# Patient Record
Sex: Female | Born: 1985 | Race: Black or African American | Hispanic: No | Marital: Single | Smoking: Never smoker
Health system: Southern US, Community
[De-identification: ages and names within clinical notes are randomized; demographics above are authoritative.]

## PROBLEM LIST (undated history)

## (undated) ENCOUNTER — Inpatient Hospital Stay: Payer: Self-pay

## (undated) DIAGNOSIS — E119 Type 2 diabetes mellitus without complications: Secondary | ICD-10-CM

## (undated) DIAGNOSIS — O009 Unspecified ectopic pregnancy without intrauterine pregnancy: Secondary | ICD-10-CM

## (undated) HISTORY — PX: ECTOPIC PREGNANCY SURGERY: SHX613

---

## 2011-05-15 ENCOUNTER — Emergency Department: Payer: Self-pay | Admitting: Emergency Medicine

## 2011-05-15 LAB — CBC
HCT: 37.9 % (ref 35.0–47.0)
HGB: 12.8 g/dL (ref 12.0–16.0)
Platelet: 261 10*3/uL (ref 150–440)
RBC: 4.2 10*6/uL (ref 3.80–5.20)
RDW: 13.3 % (ref 11.5–14.5)
WBC: 5.9 10*3/uL (ref 3.6–11.0)

## 2011-05-15 LAB — URINALYSIS, COMPLETE
Glucose,UR: NEGATIVE mg/dL (ref 0–75)
Leukocyte Esterase: NEGATIVE
Nitrite: NEGATIVE
Ph: 6 (ref 4.5–8.0)
Protein: 30
RBC,UR: 4028 /HPF (ref 0–5)
Specific Gravity: 1.025 (ref 1.003–1.030)
Squamous Epithelial: 2
WBC UR: 2 /HPF (ref 0–5)

## 2011-05-15 LAB — BASIC METABOLIC PANEL
BUN: 10 mg/dL (ref 7–18)
Calcium, Total: 8.6 mg/dL (ref 8.5–10.1)
Chloride: 107 mmol/L (ref 98–107)
Co2: 25 mmol/L (ref 21–32)
Creatinine: 0.69 mg/dL (ref 0.60–1.30)
EGFR (Non-African Amer.): >60
Glucose: 136 mg/dL — ABNORMAL HIGH (ref 65–99)
Osmolality: 284 (ref 275–301)
Potassium: 3.6 mmol/L (ref 3.5–5.1)
Sodium: 142 mmol/L (ref 136–145)

## 2011-05-15 LAB — PREGNANCY, URINE: Pregnancy Test, Urine: POSITIVE m[IU]/mL

## 2011-05-15 LAB — HCG, QUANTITATIVE, PREGNANCY: Beta Hcg, Quant.: 3456 m[IU]/mL — ABNORMAL HIGH

## 2012-02-27 ENCOUNTER — Emergency Department: Payer: Self-pay | Admitting: Emergency Medicine

## 2012-02-27 LAB — COMPREHENSIVE METABOLIC PANEL
Albumin: 4.3 g/dL (ref 3.4–5.0)
Anion Gap: 6 — ABNORMAL LOW (ref 7–16)
BUN: 8 mg/dL (ref 7–18)
Bilirubin,Total: 0.2 mg/dL (ref 0.2–1.0)
Co2: 28 mmol/L (ref 21–32)
Creatinine: 0.84 mg/dL (ref 0.60–1.30)
EGFR (African American): >60
Glucose: 136 mg/dL — ABNORMAL HIGH (ref 65–99)
Potassium: 3.5 mmol/L (ref 3.5–5.1)
SGPT (ALT): 23 U/L (ref 12–78)

## 2012-02-27 LAB — URINALYSIS, COMPLETE
Blood: NEGATIVE
Glucose,UR: NEGATIVE mg/dL (ref 0–75)
Ketone: NEGATIVE
Nitrite: NEGATIVE
Ph: 7 (ref 4.5–8.0)
Protein: NEGATIVE
Specific Gravity: 1.017 (ref 1.003–1.030)
WBC UR: <1 /HPF (ref 0–5)

## 2012-02-27 LAB — CBC
HCT: 39.8 % (ref 35.0–47.0)
HGB: 13.5 g/dL (ref 12.0–16.0)
MCH: 30 pg (ref 26.0–34.0)
MCV: 89 fL (ref 80–100)
RBC: 4.49 10*6/uL (ref 3.80–5.20)
RDW: 12.8 % (ref 11.5–14.5)
WBC: 5.7 10*3/uL (ref 3.6–11.0)

## 2012-03-14 ENCOUNTER — Emergency Department: Payer: Self-pay | Admitting: Emergency Medicine

## 2012-03-14 LAB — URINALYSIS, COMPLETE
Bacteria: NONE SEEN
Bilirubin,UR: NEGATIVE
Blood: NEGATIVE
Glucose,UR: 50 mg/dL (ref 0–75)
Ketone: NEGATIVE
Leukocyte Esterase: NEGATIVE
Ph: 6 (ref 4.5–8.0)
Protein: NEGATIVE
RBC,UR: 1 /HPF (ref 0–5)
Specific Gravity: 1.025 (ref 1.003–1.030)
Squamous Epithelial: <1
WBC UR: 1 /HPF (ref 0–5)

## 2012-03-14 LAB — CBC
HGB: 12.7 g/dL (ref 12.0–16.0)
MCHC: 32.6 g/dL (ref 32.0–36.0)
MCV: 88 fL (ref 80–100)
Platelet: 286 10*3/uL (ref 150–440)
RBC: 4.4 10*6/uL (ref 3.80–5.20)

## 2012-03-14 LAB — HCG, QUANTITATIVE, PREGNANCY: Beta Hcg, Quant.: 375 m[IU]/mL — ABNORMAL HIGH

## 2012-03-18 ENCOUNTER — Emergency Department: Payer: Self-pay | Admitting: Emergency Medicine

## 2012-03-18 LAB — URINALYSIS, COMPLETE
Bacteria: NONE SEEN
Ketone: NEGATIVE
Nitrite: NEGATIVE
Protein: NEGATIVE
RBC,UR: <1 /HPF (ref 0–5)
Specific Gravity: 1.006 (ref 1.003–1.030)
Squamous Epithelial: <1
WBC UR: <1 /HPF (ref 0–5)

## 2012-03-18 LAB — CBC
HCT: 36.6 % (ref 35.0–47.0)
MCH: 30.4 pg (ref 26.0–34.0)
MCHC: 34.7 g/dL (ref 32.0–36.0)
Platelet: 292 10*3/uL (ref 150–440)
RDW: 12.7 % (ref 11.5–14.5)

## 2012-03-31 ENCOUNTER — Ambulatory Visit: Payer: Self-pay | Admitting: Obstetrics and Gynecology

## 2012-03-31 LAB — CBC
HGB: 12.8 g/dL (ref 12.0–16.0)
MCH: 30.6 pg (ref 26.0–34.0)
MCHC: 34.7 g/dL (ref 32.0–36.0)
Platelet: 289 10*3/uL (ref 150–440)
RDW: 13 % (ref 11.5–14.5)
WBC: 8.6 10*3/uL (ref 3.6–11.0)

## 2012-03-31 LAB — COMPREHENSIVE METABOLIC PANEL
Albumin: 4.2 g/dL (ref 3.4–5.0)
Alkaline Phosphatase: 67 U/L (ref 50–136)
Anion Gap: 8 (ref 7–16)
Bilirubin,Total: 0.3 mg/dL (ref 0.2–1.0)
Calcium, Total: 9.1 mg/dL (ref 8.5–10.1)
Chloride: 105 mmol/L (ref 98–107)
Co2: 24 mmol/L (ref 21–32)
Creatinine: 0.72 mg/dL (ref 0.60–1.30)
EGFR (African American): >60
EGFR (Non-African Amer.): >60
Glucose: 149 mg/dL — ABNORMAL HIGH (ref 65–99)
Osmolality: 278 (ref 275–301)
Potassium: 3.5 mmol/L (ref 3.5–5.1)
SGOT(AST): 11 U/L — ABNORMAL LOW (ref 15–37)
SGPT (ALT): 21 U/L (ref 12–78)
Sodium: 137 mmol/L (ref 136–145)
Total Protein: 8 g/dL (ref 6.4–8.2)

## 2012-03-31 LAB — URINALYSIS, COMPLETE
Bilirubin,UR: NEGATIVE
Nitrite: NEGATIVE
Protein: NEGATIVE
Specific Gravity: 1.03 (ref 1.003–1.030)
Squamous Epithelial: 1
WBC UR: 3 /HPF (ref 0–5)

## 2012-03-31 LAB — LIPASE, BLOOD: Lipase: 198 U/L (ref 73–393)

## 2012-03-31 LAB — HCG, QUANTITATIVE, PREGNANCY: Beta Hcg, Quant.: 23827 m[IU]/mL — ABNORMAL HIGH

## 2012-04-01 LAB — CBC
HCT: 32.5 % — ABNORMAL LOW (ref 35.0–47.0)
RBC: 3.69 10*6/uL — ABNORMAL LOW (ref 3.80–5.20)
RDW: 13.4 % (ref 11.5–14.5)
WBC: 10.8 10*3/uL (ref 3.6–11.0)

## 2012-04-01 LAB — WET PREP, GENITAL

## 2013-01-18 ENCOUNTER — Emergency Department: Payer: Self-pay | Admitting: Internal Medicine

## 2013-01-18 LAB — COMPREHENSIVE METABOLIC PANEL
Albumin: 3.9 g/dL (ref 3.4–5.0)
Alkaline Phosphatase: 70 U/L
Bilirubin,Total: 0.3 mg/dL (ref 0.2–1.0)
Calcium, Total: 9.1 mg/dL (ref 8.5–10.1)
Chloride: 106 mmol/L (ref 98–107)
Co2: 28 mmol/L (ref 21–32)
Creatinine: 0.74 mg/dL (ref 0.60–1.30)
EGFR (Non-African Amer.): >60
Glucose: 90 mg/dL (ref 65–99)
SGPT (ALT): 17 U/L (ref 12–78)
Sodium: 138 mmol/L (ref 136–145)
Total Protein: 7.7 g/dL (ref 6.4–8.2)

## 2013-01-18 LAB — CBC
HCT: 36.7 % (ref 35.0–47.0)
MCH: 29.5 pg (ref 26.0–34.0)
MCHC: 34.3 g/dL (ref 32.0–36.0)
Platelet: 286 10*3/uL (ref 150–440)
RBC: 4.27 10*6/uL (ref 3.80–5.20)
RDW: 12.7 % (ref 11.5–14.5)

## 2013-03-21 ENCOUNTER — Emergency Department: Payer: Self-pay | Admitting: Emergency Medicine

## 2013-03-21 LAB — CBC WITH DIFFERENTIAL/PLATELET
Basophil #: 0.1 10*3/uL (ref 0.0–0.1)
Basophil %: 1.2 %
EOS ABS: 0 10*3/uL (ref 0.0–0.7)
Eosinophil %: 0.9 %
HCT: 38.5 % (ref 35.0–47.0)
HGB: 12.6 g/dL (ref 12.0–16.0)
Lymphocyte #: 1.6 10*3/uL (ref 1.0–3.6)
Lymphocyte %: 31 %
MCH: 28.9 pg (ref 26.0–34.0)
MCHC: 32.7 g/dL (ref 32.0–36.0)
MCV: 88 fL (ref 80–100)
Monocyte #: 0.3 x10 3/mm (ref 0.2–0.9)
Monocyte %: 5.6 %
Neutrophil #: 3.2 10*3/uL (ref 1.4–6.5)
Neutrophil %: 61.3 %
PLATELETS: 301 10*3/uL (ref 150–440)
RBC: 4.36 10*6/uL (ref 3.80–5.20)
RDW: 12.9 % (ref 11.5–14.5)
WBC: 5.2 10*3/uL (ref 3.6–11.0)

## 2013-03-21 LAB — URINALYSIS, COMPLETE
Bacteria: NONE SEEN
Bilirubin,UR: NEGATIVE
Glucose,UR: NEGATIVE mg/dL (ref 0–75)
KETONE: NEGATIVE
Leukocyte Esterase: NEGATIVE
Nitrite: NEGATIVE
Ph: 6 (ref 4.5–8.0)
Protein: NEGATIVE
RBC,UR: 11 /HPF (ref 0–5)
Specific Gravity: 1.03 (ref 1.003–1.030)
Squamous Epithelial: 1

## 2013-03-21 LAB — COMPREHENSIVE METABOLIC PANEL
ALBUMIN: 3.7 g/dL (ref 3.4–5.0)
ALT: 19 U/L (ref 12–78)
AST: 20 U/L (ref 15–37)
Alkaline Phosphatase: 69 U/L
Anion Gap: 4 — ABNORMAL LOW (ref 7–16)
BUN: 9 mg/dL (ref 7–18)
Bilirubin,Total: 0.2 mg/dL (ref 0.2–1.0)
CHLORIDE: 105 mmol/L (ref 98–107)
CO2: 28 mmol/L (ref 21–32)
CREATININE: 0.84 mg/dL (ref 0.60–1.30)
Calcium, Total: 9.3 mg/dL (ref 8.5–10.1)
EGFR (Non-African Amer.): >60
GLUCOSE: 123 mg/dL — AB (ref 65–99)
Osmolality: 274 (ref 275–301)
Potassium: 3.7 mmol/L (ref 3.5–5.1)
Sodium: 137 mmol/L (ref 136–145)
Total Protein: 7.5 g/dL (ref 6.4–8.2)

## 2013-05-17 ENCOUNTER — Emergency Department: Payer: Self-pay | Admitting: Emergency Medicine

## 2013-05-17 LAB — URINALYSIS, COMPLETE
Bilirubin,UR: NEGATIVE
Blood: NEGATIVE
Glucose,UR: NEGATIVE mg/dL (ref 0–75)
Leukocyte Esterase: NEGATIVE
Nitrite: NEGATIVE
Ph: 5 (ref 4.5–8.0)
Protein: NEGATIVE
Specific Gravity: 1.018 (ref 1.003–1.030)
Squamous Epithelial: 2

## 2013-05-17 LAB — COMPREHENSIVE METABOLIC PANEL
Albumin: 4.3 g/dL (ref 3.4–5.0)
Alkaline Phosphatase: 85 U/L
Anion Gap: 7 (ref 7–16)
BUN: 18 mg/dL (ref 7–18)
Bilirubin,Total: 0.5 mg/dL (ref 0.2–1.0)
CHLORIDE: 103 mmol/L (ref 98–107)
Calcium, Total: 9.3 mg/dL (ref 8.5–10.1)
Co2: 25 mmol/L (ref 21–32)
Creatinine: 0.84 mg/dL (ref 0.60–1.30)
EGFR (Non-African Amer.): >60
GLUCOSE: 172 mg/dL — AB (ref 65–99)
OSMOLALITY: 276 (ref 275–301)
Potassium: 3.8 mmol/L (ref 3.5–5.1)
SGOT(AST): 20 U/L (ref 15–37)
SGPT (ALT): 23 U/L (ref 12–78)
SODIUM: 135 mmol/L — AB (ref 136–145)
Total Protein: 9 g/dL — ABNORMAL HIGH (ref 6.4–8.2)

## 2013-05-17 LAB — CBC WITH DIFFERENTIAL/PLATELET
Basophil #: 0.1 10*3/uL (ref 0.0–0.1)
Basophil %: 0.7 %
EOS ABS: 0 10*3/uL (ref 0.0–0.7)
Eosinophil %: 0.3 %
HCT: 44.7 % (ref 35.0–47.0)
HGB: 14.7 g/dL (ref 12.0–16.0)
LYMPHS ABS: 1.6 10*3/uL (ref 1.0–3.6)
Lymphocyte %: 14.3 %
MCH: 28.8 pg (ref 26.0–34.0)
MCHC: 32.9 g/dL (ref 32.0–36.0)
MCV: 88 fL (ref 80–100)
MONOS PCT: 3.4 %
Monocyte #: 0.4 x10 3/mm (ref 0.2–0.9)
NEUTROS ABS: 9.1 10*3/uL — AB (ref 1.4–6.5)
Neutrophil %: 81.3 %
Platelet: 318 10*3/uL (ref 150–440)
RBC: 5.11 10*6/uL (ref 3.80–5.20)
RDW: 13.8 % (ref 11.5–14.5)
WBC: 11.2 10*3/uL — AB (ref 3.6–11.0)

## 2013-05-17 LAB — LIPASE, BLOOD: Lipase: 161 U/L (ref 73–393)

## 2013-09-13 IMAGING — US US OB < 14 WEEKS - US OB TV
1 series · 14 of 28 positions shown · non-contrast
Comparison: none

REASON FOR EXAM: preg, cramping and bleediing
COMMENTS:

PROCEDURE:     US  - US OB LESS THAN 14 WEEKS/W TRANS  - March 14, 2012 [DATE]
RESULT:     Comparison: None
TECHNIQUE: Multiple transabdominal gray-scale images and endovaginal
gray-scale images of the pelvis performed.

[Series 1: us ob < 14 weeks - us ob tv · 0.28mm/px · 14 of 50 slices shown]
[im 2/50]
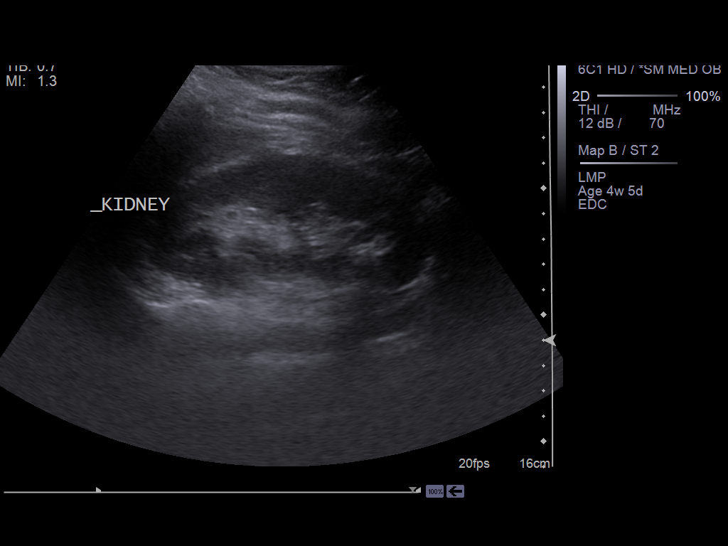
[im 6/50]
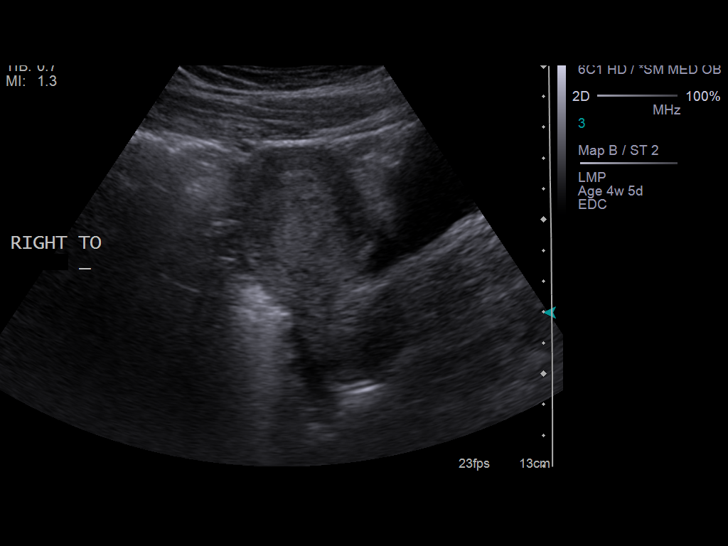
[im 10/50]
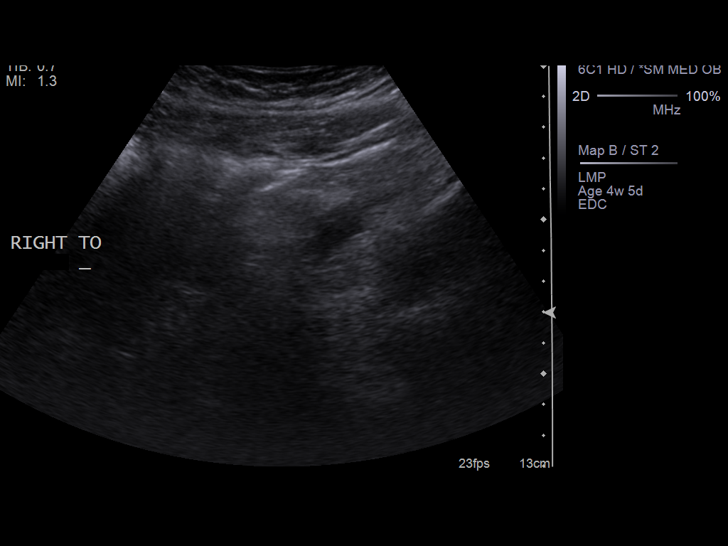
[im 13/50]
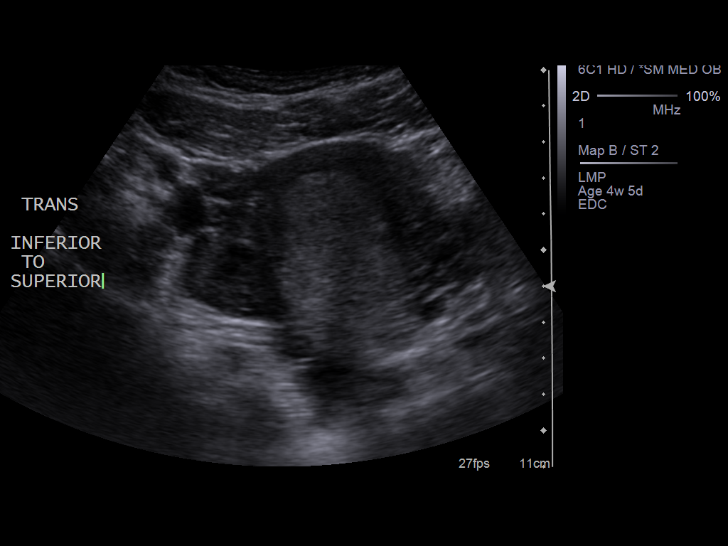
[im 17/50]
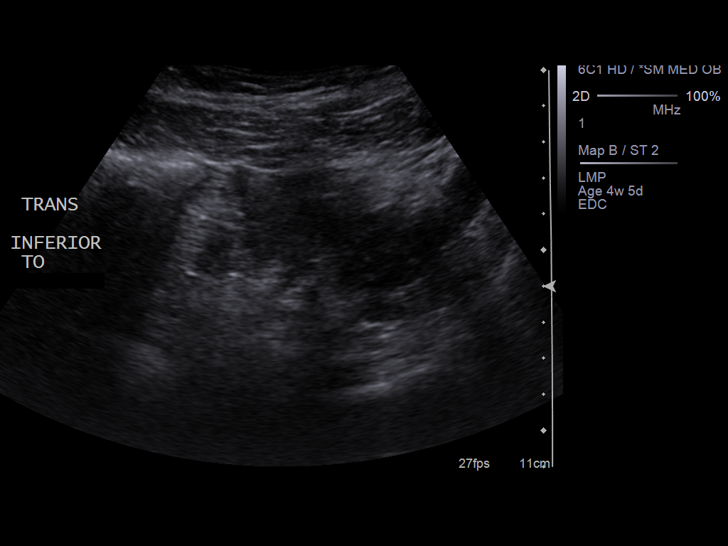
[im 20/50]
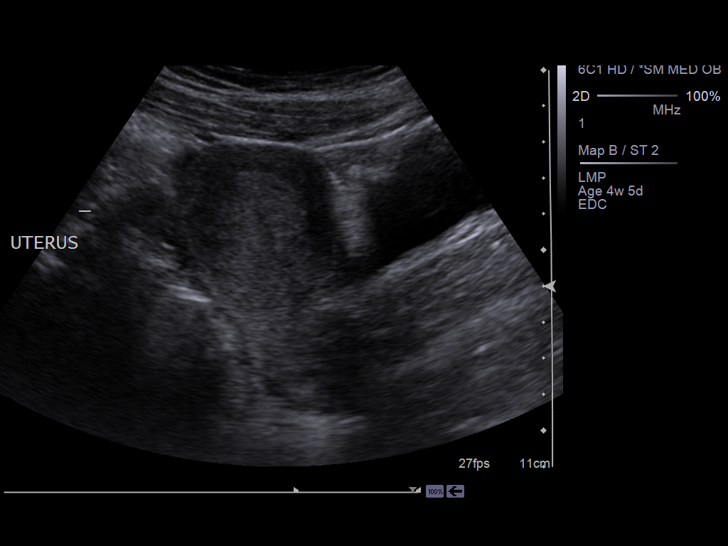
[im 24/50]
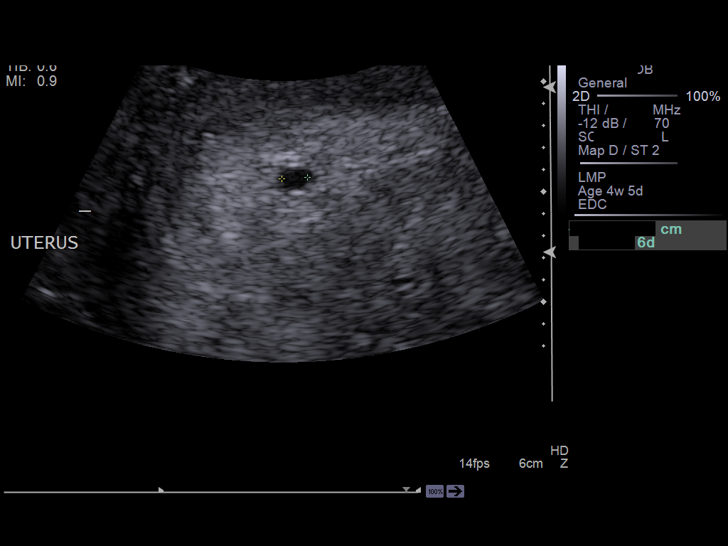
[im 28/50]
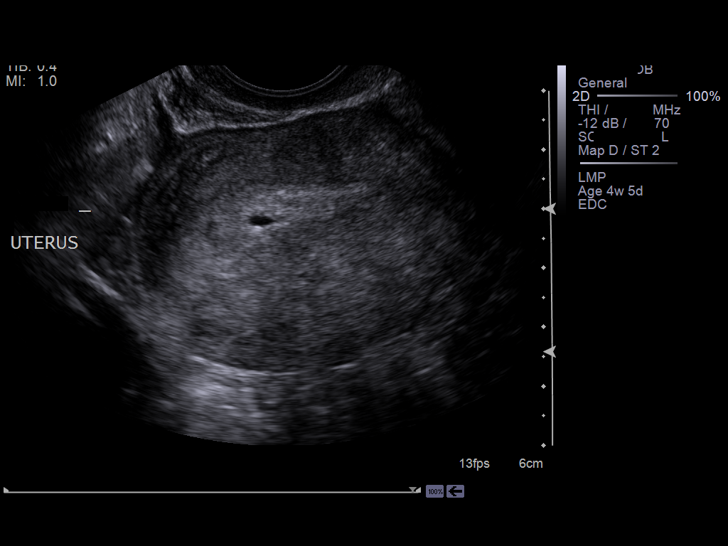
[im 31/50]
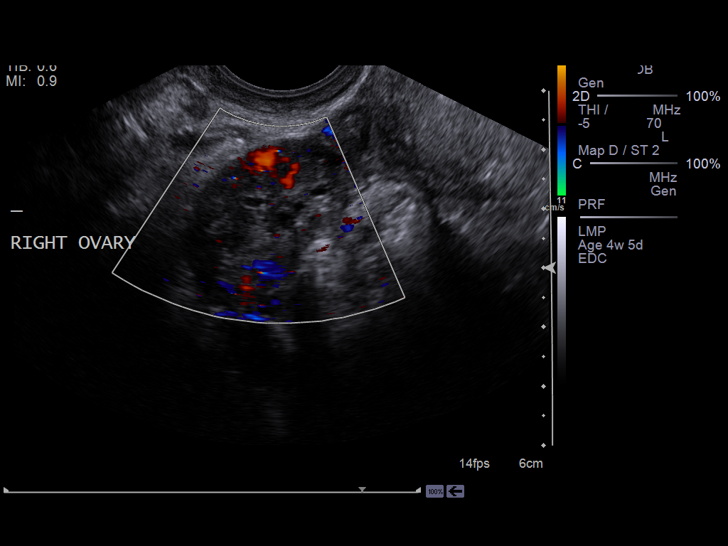
[im 35/50]
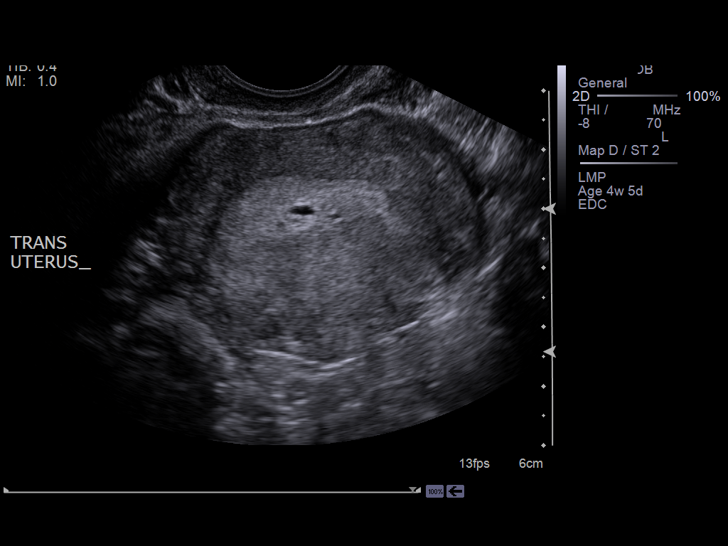
[im 39/50]
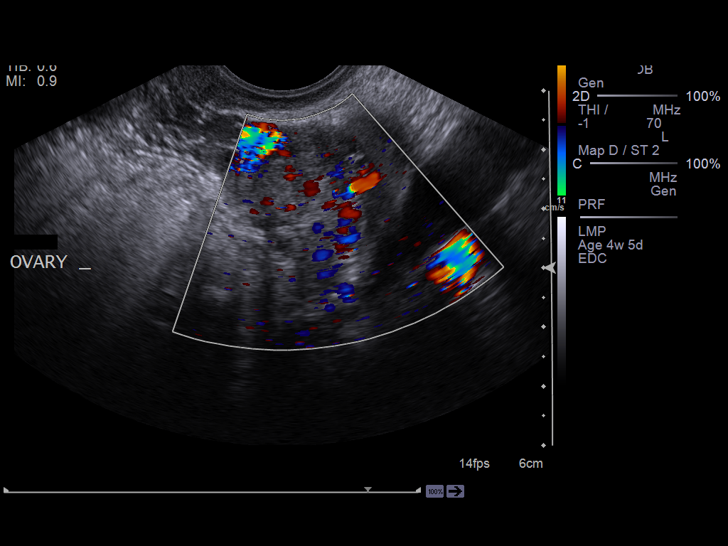
[im 42/50]
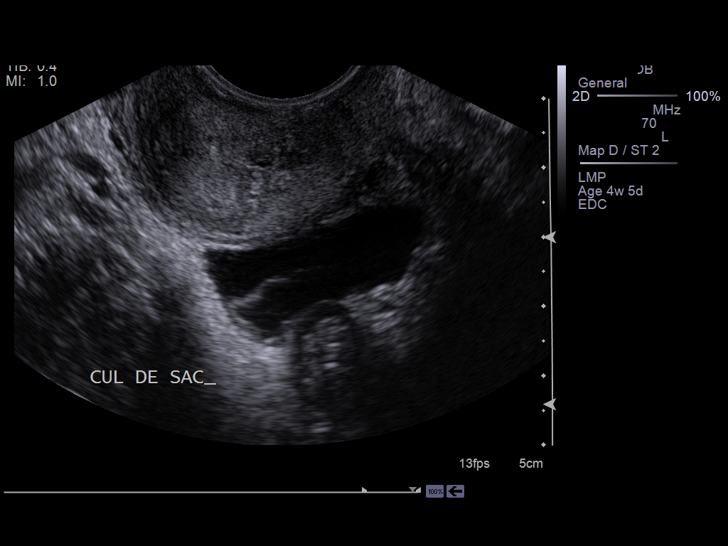
[im 46/50]
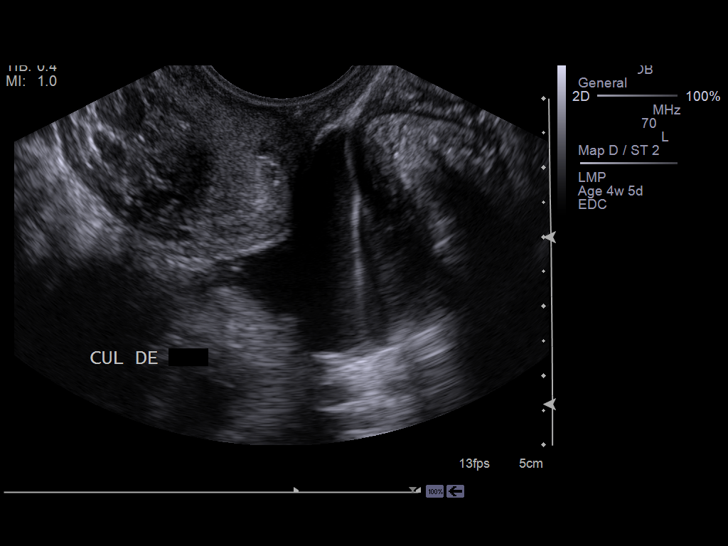
[im 50/50]
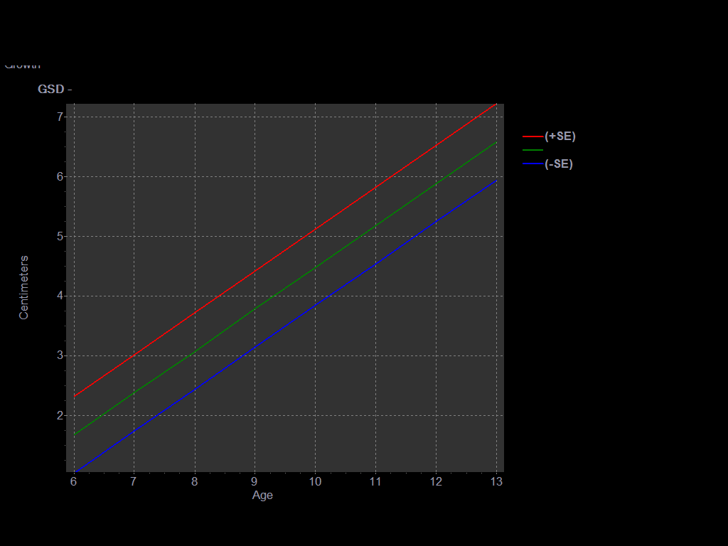

[14 of 28 positions shown; findings below may reference images not displayed]

FINDINGS: There is an intrauterine cystic structure measuring 3 mm. There is no fetal
pole or yolk sac.

The right ovary measures 4.1 x 2.4 x 2.5 cm.  The left ovary measures 3.9 x
2.5 x 2.1 cm.  There is no adnexal mass.

There is no pelvic free fluid.
IMPRESSION: Intrauterine cystic structure. Differential diagnoses include pregnancy too
early to detect versus ectopic pregnancy versus blighted ovum. Recommend
clinical correlation, serial quantitative beta HCGs, ectopic precautions,
and followup ultrasound as clinically indicated.

[REDACTED]

## 2013-09-17 IMAGING — US US OB < 14 WEEKS - US OB TV
1 series · 14 of 28 positions shown · non-contrast
Comparison: none

REASON FOR EXAM: 5 weeks with pelvic pain and vaginal bleeding
COMMENTS:

PROCEDURE:     US  - US OB LESS THAN 14 WEEKS/W TRANS  - March 18, 2012  [DATE]
RESULT:      Comparison: None
TECHNIQUE: Multiple transabdominal gray-scale images and endovaginal
gray-scale images of the pelvis performed.

[Series 1: us ob < 14 weeks - us ob tv · 0.23mm/px · 14 of 77 slices shown]
[im 3/77]
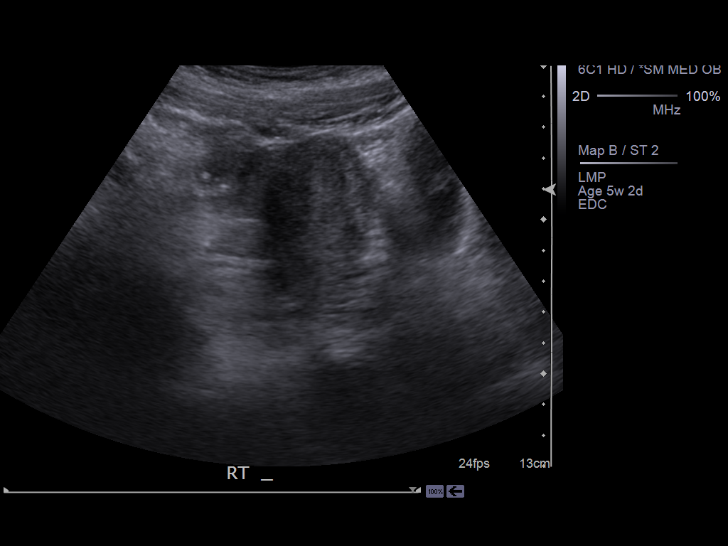
[im 9/77]
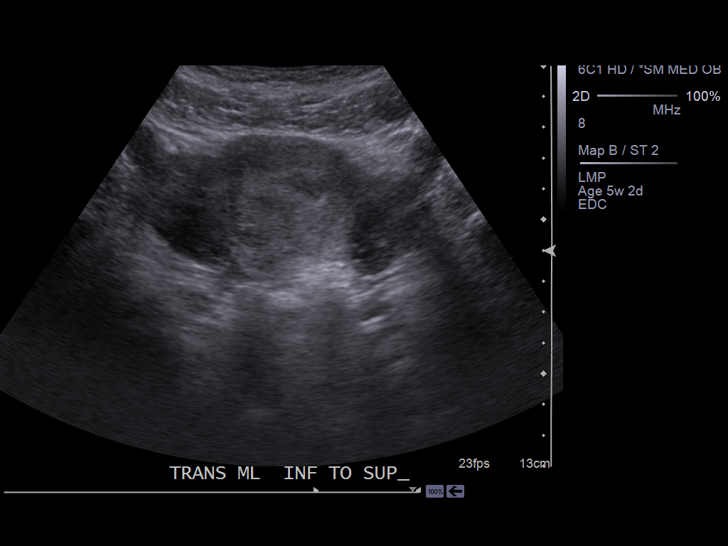
[im 15/77]
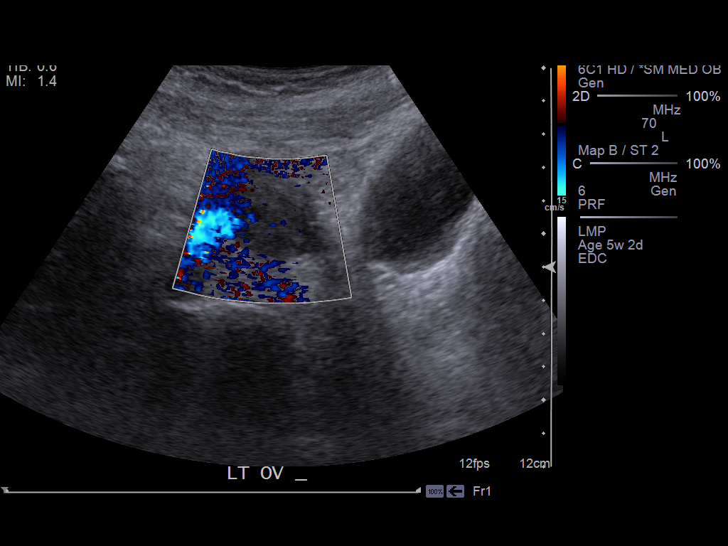
[im 20/77]
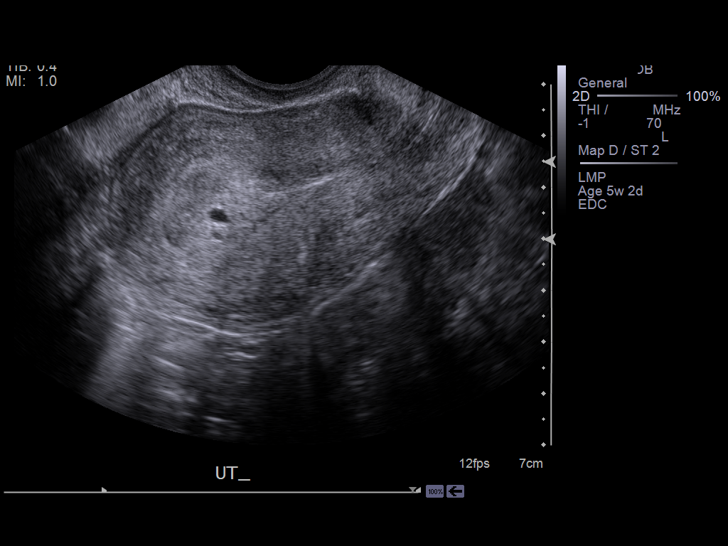
[im 26/77]
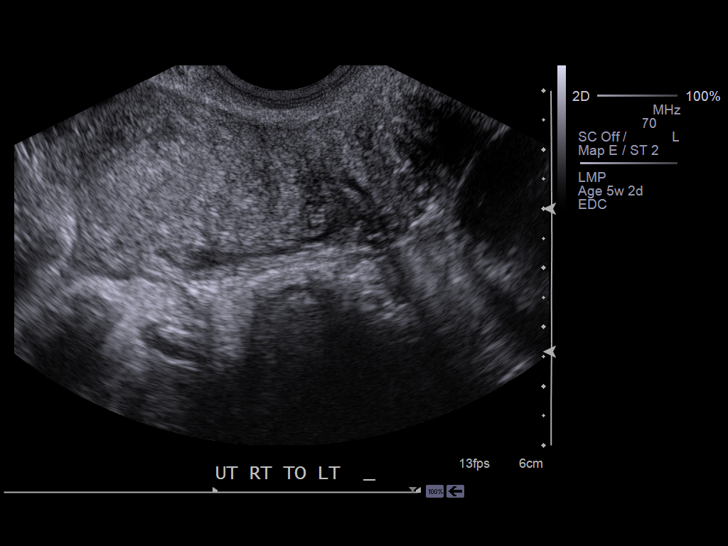
[im 31/77]
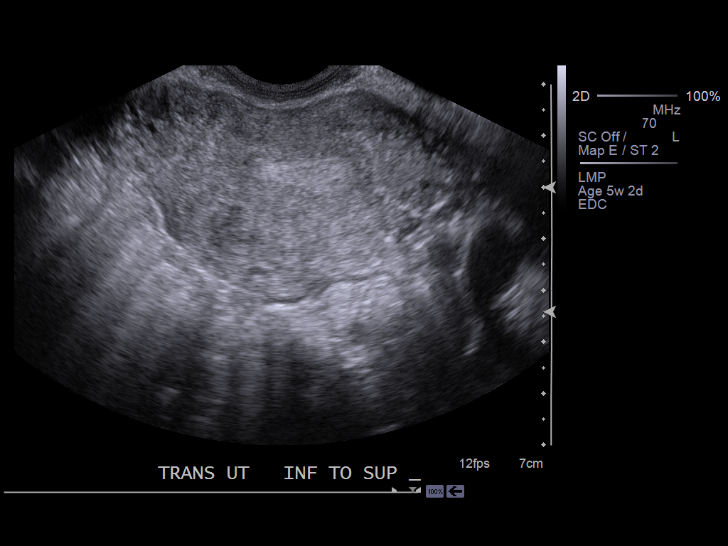
[im 37/77]
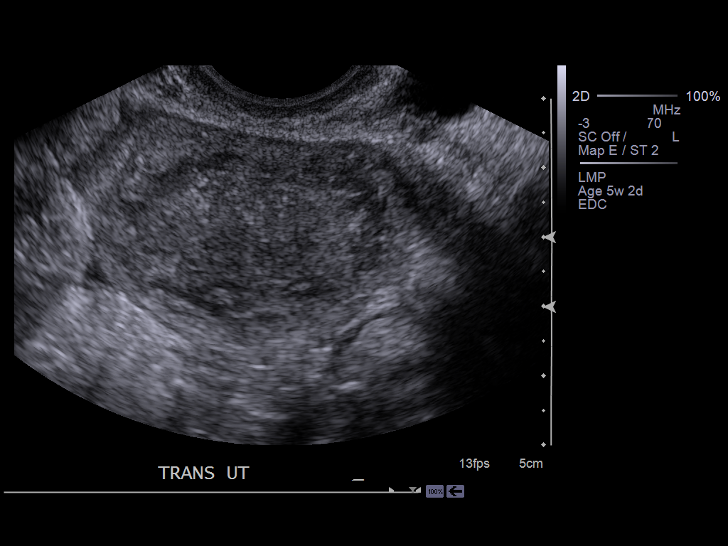
[im 43/77]
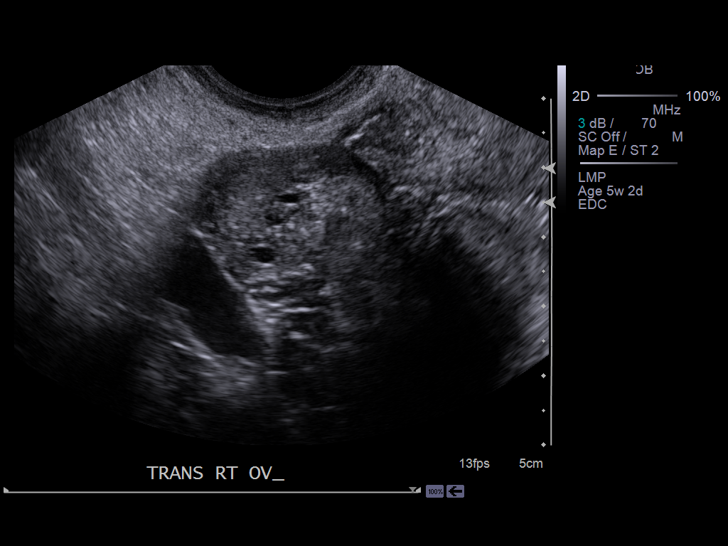
[im 48/77]
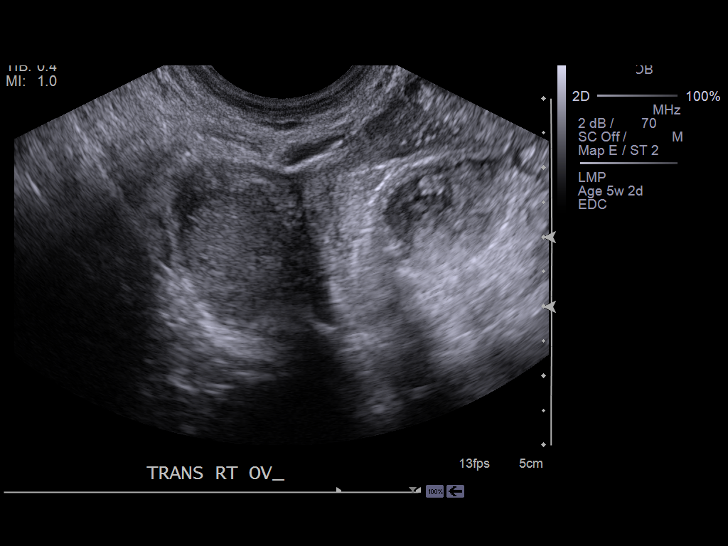
[im 54/77]
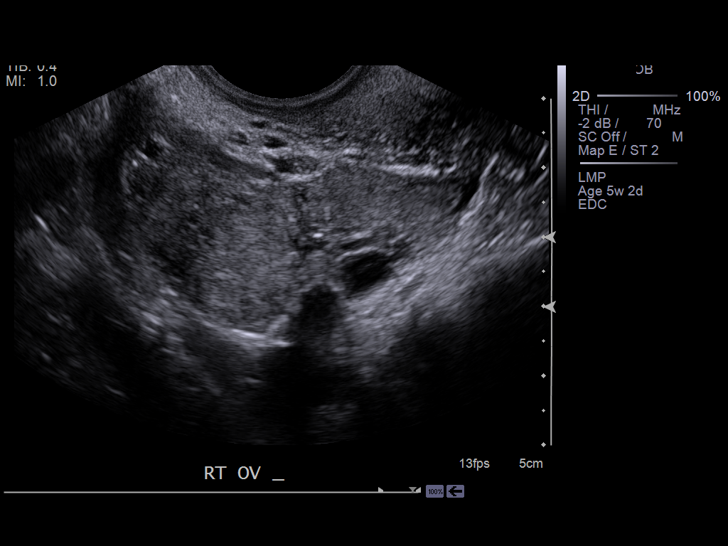
[im 60/77]
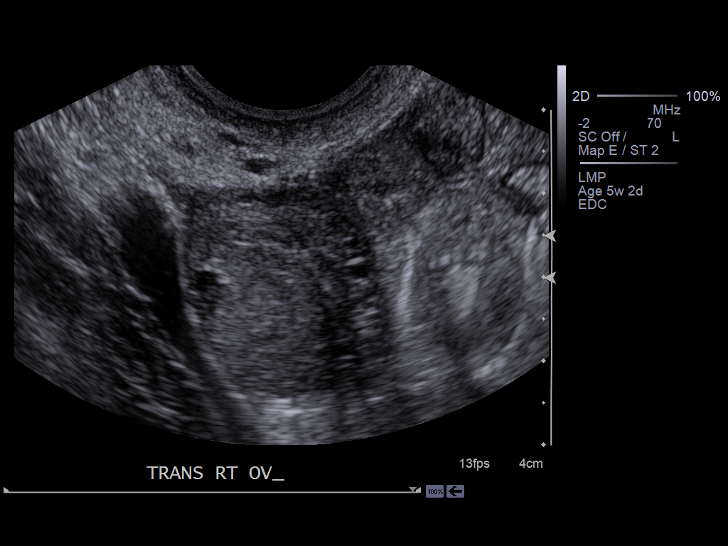
[im 65/77]
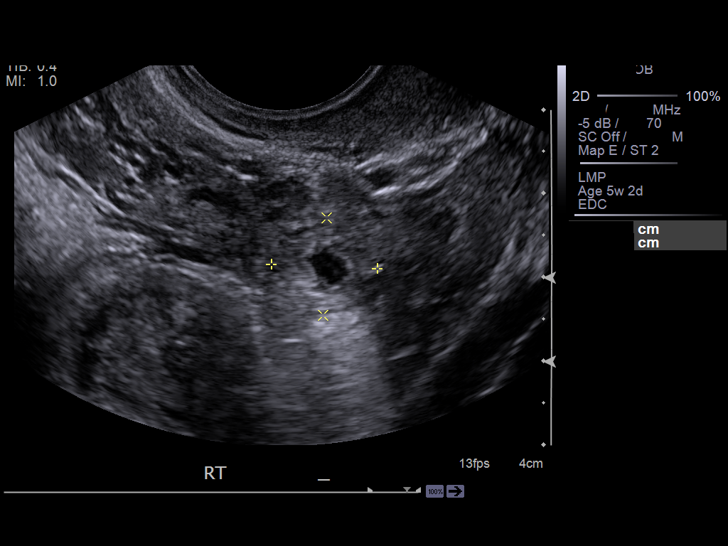
[im 71/77]
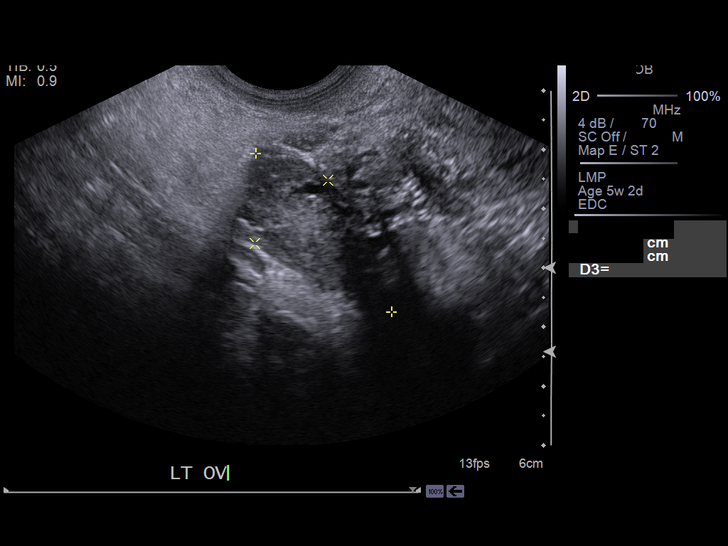
[im 77/77]
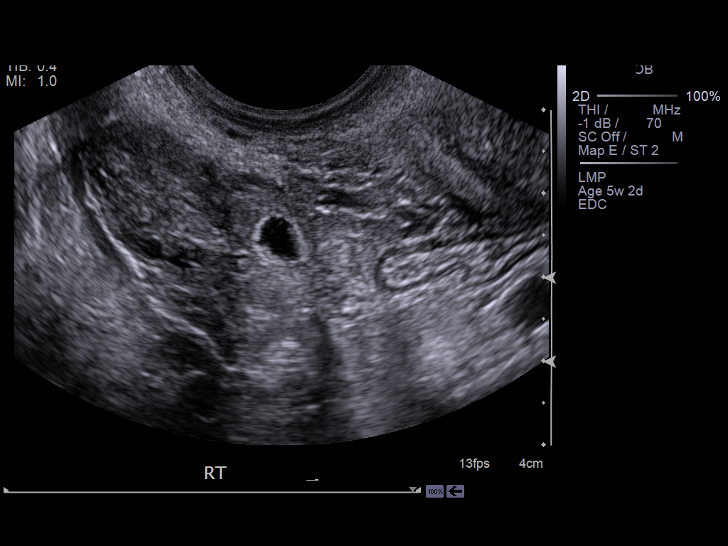

[14 of 28 positions shown; findings below may reference images not displayed]

FINDINGS: There is a cystic structure within the uterus measuring 3.8 mm which would
date the pregnancy at 5 weeks one day. There is no fetal pole or yolk sac.

The right ovary measures 3.2 x 2.3 x 3.7 cm.  The left ovary measures 3.5 x
1.7, 1.6 cm. There is a 1.6 x 1.2 x 1.2 cm hypoechoic right ovarian mass
with surrounding Doppler flow which may represent a corpus luteum cyst.

There is a small amount of pelvic free fluid.
IMPRESSION: Small cystic structure within the uterus without a fetal pole or yolk sac.
This may represent a true gestational sac versus pseudogestational sac.
Differential diagnoses include a pregnant to early to detect versus blighted
ovum versus ectopic pregnancy. Recommend clinical correlation, serial
quantitative beta HCGs, ectopic precautions, and short-term followup
ultrasound.

[REDACTED]

## 2014-06-07 NOTE — H&P (Signed)
   Subjective/Chief Complaint pain in her abdomen   History of Present Illness 26 yp G3P1011 wiuth ectopic of unknown age. by US with blood up to her Morrisons pouch, she does not smoke she does not douche and no abnl pap recently   Past Medical Health Diabetes Mellitus   Past Med/Surgical Hx:  NIDDM:   denies surg hx:   ALLERGIES:  Latex: Swelling  Family and Social History:  Family History Non-Contributory   Social History negative tobacco, negative ETOH   Place of Living Home   Review of Systems:  Fever/Chills No   Cough No   Sputum No   Abdominal Pain Yes   Diarrhea No   Constipation Yes   Nausea/Vomiting No   SOB/DOE No   Chest Pain No   Dysuria No   Tolerating Diet Nauseated   Medications/Allergies Reviewed Medications/Allergies reviewed   Physical Exam:  GEN well developed, well nourished   HEENT PERRL   NECK supple  No masses  thyroid not tender   RESP normal resp effort   CARD regular rate  No LE edema   ABD positive tenderness   GU no superpubic tenderness   LYMPH negative neck, negative axillae   EXTR negative cyanosis/clubbing, negative edema   SKIN normal to palpation, No rashes   NEURO cranial nerves intact   PSYCH alert    Assessment/Admission Diagnosis ectopic pregnanacy with blood in abdomen   Plan l/s ectopic removal   Electronic Signatures: Adria DevonKlett, Elah Avellino (MD)  (Signed 15-Feb-14 14:44)  Authored: CHIEF COMPLAINT and HISTORY, PAST MEDICAL/SURGIAL HISTORY, ALLERGIES, FAMILY AND SOCIAL HISTORY, REVIEW OF SYSTEMS, PHYSICAL EXAM, ASSESSMENT AND PLAN   Last Updated: 15-Feb-14 14:44 by Adria DevonKlett, Micael Barb (MD)

## 2014-06-07 NOTE — Op Note (Signed)
PATIENT NAME:  Meredith Rasmussen, [name] MR#:  [id number] DATE OF BIRTH:  July 17, 1985  DATE OF PROCEDURE:  04/01/2012  PREOPERATIVE DIAGNOSIS: Right ruptured ectopic with blood to Morison's pouch.  POSTOPERATIVE DIAGNOSIS:  Right ruptured ectopic with blood to Morison's pouch.  PROCEDURE PERFORMED: Laparoscopic right salpingectomy and irrigation of the abdomen.   SURGEON: Loura Backarried Alix Stowers, MD  ESTIMATED BLOOD LOSS: Approximately 200 mL, however, approximately 1000 mL were evacuated from the abdomen from the rupture that was old and clotted.  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  DESCRIPTION OF PROCEDURE: The patient was taken to the operating room and placed in supine position. After adequate general endotracheal anesthesia was instilled, the patient was prepped and draped in the usual sterile fashion. The patient had been consented for this surgery prior to her surgery with her boyfriend and a nurse present. A side-opening speculum was placed in the patient's vagina, the anterior lip of the cervix was grasped with a single-tooth tenaculum and Hulka tenaculum was placed into the cervix. The single-tooth tenaculum was removed as was the speculum. Attention was turned to the belly where the umbilicus was injected with Marcaine and the incision was made. Veress needle was placed. CO2 was placed on low flow. When tympany was heard around the liver, CO2 was placed on high flow. The Veress needle was removed after hanging drop test, fluid instillation test and fluid aspiration test showed proper placement of the Veress needle. Xcel trocar was used to enter into the abdomen and the patient was placed in Trendelenburg. The aforementioned findings were seen with belly full of blood and old clots were seen. This was  aspirated and irrigated enough to see the right tube. The right tube was then grasped after a 5 mm and 10 mm trocar port was placed in the left and the right lower quadrants. The tube was then grasped, the Harmonic scalpel was used and the tube was cut off of the tubal junction into the uterus and the peritoneal tissue of the broad ligament. This bleeding immediately stopped. At this time, the ectopic was identified in the pelvis. This was grasped. The tube and the ectopic were sent off to pathology. The remainder of the surgery consisted of aspirating the blood that was up to Morison's pouch, irrigating the belly and removing the clots. Approximately 9000 mL of fluid and blood was aspirated. The lower trocars were removed. The CO2 was allowed to escape from the abdomen. The incisions were closed. The bellybutton trocar was removed. UR-6 with 4-0 Monocryl was used to close the incision. Dermabond was placed. The Hulka tenaculum was removed from the patient's cervix. The patient was laid flat and taken to recovery after having tolerated the procedure well.  ____________________________ Elliot Gurneyarrie C. Jeven Topper, MD cck:sb D: 04/10/2012 08:38:00 ET T: 04/10/2012 10:16:05 ET JOB#: [id number]  cc: Elliot Gurneyarrie C. Anwar Sakata, MD, <Dictator> Elliot GurneyARRIE C Boni Maclellan MD ELECTRONICALLY SIGNED 04/11/2012 19:01

## 2014-07-17 ENCOUNTER — Emergency Department: Payer: No Typology Code available for payment source

## 2014-07-17 ENCOUNTER — Emergency Department
Admission: EM | Admit: 2014-07-17 | Discharge: 2014-07-17 | Disposition: A | Discharge status: Home / Self Care | Payer: No Typology Code available for payment source | Attending: Emergency Medicine | Admitting: Emergency Medicine

## 2014-07-17 DIAGNOSIS — Z3A11 11 weeks gestation of pregnancy: Secondary | ICD-10-CM | POA: Insufficient documentation

## 2014-07-17 DIAGNOSIS — N390 Urinary tract infection, site not specified: Secondary | ICD-10-CM

## 2014-07-17 DIAGNOSIS — O30091 Twin pregnancy, unable to determine number of placenta and number of amniotic sacs, first trimester: Secondary | ICD-10-CM | POA: Diagnosis not present

## 2014-07-17 DIAGNOSIS — N939 Abnormal uterine and vaginal bleeding, unspecified: Secondary | ICD-10-CM

## 2014-07-17 DIAGNOSIS — O200 Threatened abortion: Secondary | ICD-10-CM | POA: Insufficient documentation

## 2014-07-17 DIAGNOSIS — Z79899 Other long term (current) drug therapy: Secondary | ICD-10-CM | POA: Insufficient documentation

## 2014-07-17 DIAGNOSIS — O209 Hemorrhage in early pregnancy, unspecified: Secondary | ICD-10-CM | POA: Diagnosis present

## 2014-07-17 DIAGNOSIS — O2341 Unspecified infection of urinary tract in pregnancy, first trimester: Secondary | ICD-10-CM | POA: Insufficient documentation

## 2014-07-17 DIAGNOSIS — O24311 Unspecified pre-existing diabetes mellitus in pregnancy, first trimester: Secondary | ICD-10-CM | POA: Diagnosis not present

## 2014-07-17 DIAGNOSIS — O30001 Twin pregnancy, unspecified number of placenta and unspecified number of amniotic sacs, first trimester: Secondary | ICD-10-CM

## 2014-07-17 HISTORY — DX: Type 2 diabetes mellitus without complications: E11.9

## 2014-07-17 HISTORY — DX: Unspecified ectopic pregnancy without intrauterine pregnancy: O00.90

## 2014-07-17 LAB — URINALYSIS COMPLETE WITH MICROSCOPIC (ARMC ONLY)
BILIRUBIN URINE: NEGATIVE
Glucose, UA: NEGATIVE mg/dL
NITRITE: NEGATIVE
PH: 5 (ref 5.0–8.0)
Protein, ur: 30 mg/dL — AB
Specific Gravity, Urine: 1.026 (ref 1.005–1.030)

## 2014-07-17 LAB — CBC
HEMATOCRIT: 34.9 % — AB (ref 35.0–47.0)
Hemoglobin: 11.9 g/dL — ABNORMAL LOW (ref 12.0–16.0)
MCH: 29.3 pg (ref 26.0–34.0)
MCHC: 34.2 g/dL (ref 32.0–36.0)
MCV: 85.6 fL (ref 80.0–100.0)
Platelets: 315 10*3/uL (ref 150–440)
RBC: 4.07 MIL/uL (ref 3.80–5.20)
RDW: 13.8 % (ref 11.5–14.5)
WBC: 9 10*3/uL (ref 3.6–11.0)

## 2014-07-17 LAB — WET PREP, GENITAL
Clue Cells Wet Prep HPF POC: NONE SEEN
TRICH WET PREP: NONE SEEN
Yeast Wet Prep HPF POC: NONE SEEN

## 2014-07-17 LAB — CHLAMYDIA/NGC RT PCR (ARMC ONLY)
CHLAMYDIA TR: NOT DETECTED
N gonorrhoeae: NOT DETECTED

## 2014-07-17 LAB — ABO/RH
ABO/RH(D): B POS
ABO/RH(D): B POS

## 2014-07-17 LAB — GLUCOSE, CAPILLARY: GLUCOSE-CAPILLARY: 109 mg/dL — AB (ref 65–99)

## 2014-07-17 LAB — HCG, QUANTITATIVE, PREGNANCY: hCG, Beta Chain, Quant, S: 221071 m[IU]/mL — ABNORMAL HIGH (ref <5)

## 2014-07-17 MED ORDER — NITROFURANTOIN MONOHYD MACRO 100 MG PO CAPS: 100.0000 mg | ORAL_CAPSULE | Freq: Two times a day (BID) | ORAL | Status: AC

## 2014-07-17 MED ORDER — NITROFURANTOIN MONOHYD MACRO 100 MG PO CAPS
100.0000 mg | ORAL_CAPSULE | Freq: Once | ORAL | Status: AC
  Administered 2014-07-17: 100 mg via ORAL

## 2014-07-17 MED ORDER — NITROFURANTOIN MACROCRYSTAL 100 MG PO CAPS
ORAL_CAPSULE | ORAL | Status: DC
Start: 2014-07-17 — End: 2014-07-18
  Filled 2014-07-17: qty 1

## 2014-07-17 NOTE — ED Notes (Signed)
Pt states she noticed blood in her urine this afternoon, pt is [redacted] weeks pregnant, pt states some mild cramping

## 2014-07-17 NOTE — ED Provider Notes (Signed)
[facility] Emergency Department Provider Note  ____________________________________________  Time seen: Approximately 4:15 PM  I have reviewed the triage vital signs and the nursing notes.   HISTORY  Chief Complaint Vaginal Bleeding    HPI Nance Pewrica D Eline is a 29 y.o. female who is [redacted] weeks pregnant who presents with lower abdominal cramping as well as vaginal bleeding earlier today. Patient says that she was going to the bathroom when she found bright red blood in her underwear as well as on the toilet tissue. She did not see any tissue from the vagina. Says the bleeding has slowed at this time. History of ectopic in the past and history of chlamydia. No prenatal care during this pregnancy because says needs to [facility] of [facility]. Currently on metformin and prenatal vitamins. Patient is G4.   Past Medical History  Diagnosis Date  . Diabetes mellitus without complication   . Ectopic pregnancy     There are no active problems to display for this patient.   Past Surgical History  Procedure Laterality Date  . Ectopic pregnancy surgery      Current Outpatient Rx  Name  Route  Sig  Dispense  Refill  . metFORMIN (GLUCOPHAGE) 1000 MG tablet   Oral   Take 1,000 mg by mouth 2 (two) times daily with a meal.         . Prenatal Vit-Fe Fumarate-FA (MULTIVITAMIN-PRENATAL) 27-0.8 MG TABS tablet   Oral   Take 1 tablet by mouth daily at 12 noon.           Allergies Review of patient's allergies indicates no known allergies.  No family history on file.  Social History History  Substance Use Topics  . Smoking status: Never Smoker   . Smokeless tobacco: Never Used  . Alcohol Use: No    Review of Systems Constitutional: No fever/chills Eyes: No visual changes. ENT: No sore throat. Cardiovascular: Denies chest pain. Respiratory: Denies shortness of breath. Gastrointestinal:lower abd pain.  No nausea, no  vomiting.  No diarrhea.  No constipation. Genitourinary: Negative for dysuria. Musculoskeletal: Negative for back pain. Skin: Negative for rash. Neurological: Negative for headaches, focal weakness or numbness.  10-point ROS otherwise negative.  ____________________________________________   PHYSICAL EXAM:  VITAL SIGNS: ED Triage Vitals  Enc Vitals Group     BP 07/17/14 1423 122/80 mmHg     Pulse Rate 07/17/14 1423 97     Resp 07/17/14 1423 17     Temp 07/17/14 1423 98.1 F (36.7 C)     Temp Source 07/17/14 1423 Oral     SpO2 07/17/14 1423 100 %     Weight 07/17/14 1423 185 lb (83.915 kg)     Height 07/17/14 1423 4\' 11"  (1.499 m)     Head Cir --      Peak Flow --      Pain Score 07/17/14 1423 2     Pain Loc --      Pain Edu? --      Excl. in GC? --     Constitutional: Alert and oriented. Well appearing and in no acute distress. Eyes: Conjunctivae are normal. PERRL. EOMI. Head: Atraumatic. Nose: No congestion/rhinnorhea. Mouth/Throat: Mucous membranes are moist.  Oropharynx non-erythematous. Neck: No stridor.   Cardiovascular: Normal rate, regular rhythm. Grossly normal heart sounds.  Good peripheral circulation. Respiratory: Normal respiratory effort.  No retractions. Lungs CTAB. Gastrointestinal: Mild suprapubic tenderness palpation. The abdomen is soft.. No distention.  No abdominal bruits. No CVA tenderness. Genitourinary: Normal external exam. Speculum exam without active bleeding or discharge. Bimanual exam with closed cervix, no CMT mild, uterine tenderness. There is no adnexal tenderness or mass.   Musculoskeletal: No lower extremity tenderness nor edema.  No joint effusions. Neurologic:  Normal speech and language. No gross focal neurologic deficits are appreciated. Speech is normal. No gait instability. Skin:  Skin is warm, dry and intact. No rash noted. Psychiatric: Mood and affect are normal. Speech and behavior are  normal.  ____________________________________________   LABS (all labs ordered are listed, but only abnormal results are displayed)  Labs Reviewed  WET PREP, GENITAL - Abnormal; Notable for the following:    WBC, Wet Prep HPF POC FEW (*)    All other components within normal limits  CBC - Abnormal; Notable for the following:    Hemoglobin 11.9 (*)    HCT 34.9 (*)    All other components within normal limits  HCG, QUANTITATIVE, PREGNANCY - Abnormal; Notable for the following:    hCG, Beta Chain, Mahalia Longest [id number] (*)    All other components within normal limits  URINALYSIS COMPLETEWITH MICROSCOPIC ([facility] ONLY) - Abnormal; Notable for the following:    Color, Urine YELLOW (*)    APPearance CLOUDY (*)    Ketones, ur TRACE (*)    Hgb urine dipstick 2+ (*)    Protein, ur 30 (*)    Leukocytes, UA 1+ (*)    Bacteria, UA RARE (*)    Squamous Epithelial / LPF TOO NUMEROUS TO COUNT (*)    All other components within normal limits  GLUCOSE, CAPILLARY - Abnormal; Notable for the following:    Glucose-Capillary 109 (*)    All other components within normal limits  CHLAMYDIA/NGC RT PCR ([facility] ONLY)  CBG MONITORING, ED  ABO/RH  ABO/RH   ____________________________________________  EKG   ____________________________________________  RADIOLOGY  Live dichorionic twin IUP with estimated gestational age of [redacted] weeks and 6 days. Small subchorionic hemorrhage noted ____________________________________________   PROCEDURES    ____________________________________________   INITIAL IMPRESSION / ASSESSMENT AND PLAN / ED COURSE  Pertinent labs & imaging results that were available during my care of the patient were reviewed by me and considered in my medical decision making (see chart for details).  ----------------------------------------- 9:01 PM on 07/17/2014 -----------------------------------------  Is resting comfortably. Blood type is B+. Patient aware of dye  chorionic twins. Discussed ultrasound results including the subchorionic hemorrhage. Patient given copy of ultrasound report to take with her to her follow-up appointment at [facility]. We'll also treat for urinary tract infection. Treating because patient already high risk and 1+ leukocytes. Reports urinary frequency. Possibly also just secondary to pregnancy, however patient already high risk so will treat. To discharge. ____________________________________________   FINAL CLINICAL IMPRESSION(S) / ED DIAGNOSES  Twin pregnancy. Threatened abortion. Urinary tract infection. Acute, initial visit.    Myrna Blazer, MD 07/17/14 2103

## 2014-07-17 NOTE — ED Notes (Signed)
Pt states she is [redacted] weeks pregnant and while on her lunch break she wiped and saw blood. Pt has not had any prenatal care, [facility] diabetes and high risk..Marland Kitchen

## 2014-07-19 ENCOUNTER — Encounter: Payer: Self-pay | Admitting: Emergency Medicine

## 2014-07-19 ENCOUNTER — Emergency Department: Payer: No Typology Code available for payment source

## 2014-07-19 ENCOUNTER — Emergency Department
Admission: EM | Admit: 2014-07-19 | Discharge: 2014-07-19 | Disposition: A | Discharge status: Home / Self Care | Payer: No Typology Code available for payment source | Attending: Emergency Medicine | Admitting: Emergency Medicine

## 2014-07-19 DIAGNOSIS — Z3A10 10 weeks gestation of pregnancy: Secondary | ICD-10-CM | POA: Insufficient documentation

## 2014-07-19 DIAGNOSIS — Z79899 Other long term (current) drug therapy: Secondary | ICD-10-CM | POA: Insufficient documentation

## 2014-07-19 DIAGNOSIS — O99281 Endocrine, nutritional and metabolic diseases complicating pregnancy, first trimester: Secondary | ICD-10-CM | POA: Diagnosis not present

## 2014-07-19 DIAGNOSIS — O209 Hemorrhage in early pregnancy, unspecified: Secondary | ICD-10-CM | POA: Diagnosis present

## 2014-07-19 DIAGNOSIS — E119 Type 2 diabetes mellitus without complications: Secondary | ICD-10-CM | POA: Insufficient documentation

## 2014-07-19 DIAGNOSIS — O200 Threatened abortion: Secondary | ICD-10-CM | POA: Insufficient documentation

## 2014-07-19 DIAGNOSIS — Z8742 Personal history of other diseases of the female genital tract: Secondary | ICD-10-CM

## 2014-07-19 DIAGNOSIS — Z3A11 11 weeks gestation of pregnancy: Secondary | ICD-10-CM | POA: Diagnosis not present

## 2014-07-19 DIAGNOSIS — Z3490 Encounter for supervision of normal pregnancy, unspecified, unspecified trimester: Secondary | ICD-10-CM

## 2014-07-19 LAB — URINALYSIS COMPLETE WITH MICROSCOPIC (ARMC ONLY)
BILIRUBIN URINE: NEGATIVE
Glucose, UA: NEGATIVE mg/dL
KETONES UR: NEGATIVE mg/dL
Leukocytes, UA: NEGATIVE
NITRITE: NEGATIVE
PH: 5 (ref 5.0–8.0)
Protein, ur: NEGATIVE mg/dL
SPECIFIC GRAVITY, URINE: 1.026 (ref 1.005–1.030)

## 2014-07-19 LAB — CBC
HCT: 35.3 % (ref 35.0–47.0)
Hemoglobin: 11.9 g/dL — ABNORMAL LOW (ref 12.0–16.0)
MCH: 29.2 pg (ref 26.0–34.0)
MCHC: 33.6 g/dL (ref 32.0–36.0)
MCV: 86.8 fL (ref 80.0–100.0)
PLATELETS: 289 10*3/uL (ref 150–440)
RBC: 4.07 MIL/uL (ref 3.80–5.20)
RDW: 13.9 % (ref 11.5–14.5)
WBC: 7.8 10*3/uL (ref 3.6–11.0)

## 2014-07-19 NOTE — ED Notes (Signed)
Pt is [redacted] weeks pregnant with twins was seen here Wednesday and had normal ultrasound and told to follow up with ob.  Today started bleeding again co co lower abd cramping.

## 2014-07-19 NOTE — ED Provider Notes (Signed)
[facility] Emergency Department Provider Note    ____________________________________________  Time seen: 0810  I have reviewed the triage vital signs and the nursing notes.   HISTORY  Chief Complaint Vaginal Bleeding   History limited by: Not Limited   HPI Meredith Rasmussen is a 29 y.o. female at roughly [redacted] weeks pregnant who presents to the emergency department today because of concerns for vaginal bleeding. The patient was seen in the emergency department 2 days ago for the same symptoms and had a ultrasound done at that time which showed a intrauterine twin pregnancy. Patient states she had been doing okay and then developed some bleeding again today. Denies any severe pain. Patient denies any recent fevers, shortness of breath or chest pain.     Past Medical History  Diagnosis Date  . Diabetes mellitus without complication   . Ectopic pregnancy     There are no active problems to display for this patient.   Past Surgical History  Procedure Laterality Date  . Ectopic pregnancy surgery      Current Outpatient Rx  Name  Route  Sig  Dispense  Refill  . metFORMIN (GLUCOPHAGE) 1000 MG tablet   Oral   Take 1,000 mg by mouth 2 (two) times daily with a meal.         . Prenatal Vit-Fe Fumarate-FA (MULTIVITAMIN-PRENATAL) 27-0.8 MG TABS tablet   Oral   Take 1 tablet by mouth daily at 12 noon.         . nitrofurantoin, macrocrystal-monohydrate, (MACROBID) 100 MG capsule   Oral   Take 1 capsule (100 mg total) by mouth 2 (two) times daily. Patient not taking: Reported on 07/19/2014   14 capsule   0     Allergies Review of patient's allergies indicates no known allergies.  No family history on file.  Social History History  Substance Use Topics  . Smoking status: Never Smoker   . Smokeless tobacco: Never Used  . Alcohol Use: No    Review of Systems  Constitutional: Negative for fever. Cardiovascular: Negative for chest  pain. Respiratory: Negative for shortness of breath. Gastrointestinal: Negative for abdominal pain, vomiting and diarrhea. Genitourinary: Negative for dysuria. Vaginal bleeding Musculoskeletal: Negative for back pain. Skin: Negative for rash. Neurological: Negative for headaches, focal weakness or numbness.   10-point ROS otherwise negative.  ____________________________________________   PHYSICAL EXAM:  VITAL SIGNS: ED Triage Vitals  Enc Vitals Group     BP 07/19/14 0411 105/55 mmHg     Pulse Rate 07/19/14 0411 78     Resp 07/19/14 0411 18     Temp 07/19/14 0411 98.3 F (36.8 C)     Temp Source 07/19/14 0411 Oral     SpO2 07/19/14 0411 97 %     Weight 07/19/14 0411 185 lb (83.915 kg)     Height 07/19/14 0411  (1.499 m)     Head Cir --      Peak Flow --      Pain Score 07/19/14 0411 4    Constitutional: Alert and oriented. Well appearing and in no distress. Eyes: Conjunctivae are normal. PERRL. Normal extraocular movements. ENT   Head: Normocephalic and atraumatic.   Nose: No congestion/rhinnorhea.   Mouth/Throat: Mucous membranes are moist.   Neck: No stridor. Hematological/Lymphatic/Immunilogical: No cervical lymphadenopathy. Cardiovascular: Normal rate, regular rhythm.  No murmurs, rubs, or gallops. Respiratory: Normal respiratory effort without tachypnea nor retractions. Breath sounds are clear and equal bilaterally. No wheezes/rales/rhonchi. Gastrointestinal: Soft and  nontender. No distention.  Genitourinary: Deferred Musculoskeletal: Normal range of motion in all extremities. No joint effusions.  No lower extremity tenderness nor edema. Neurologic:  Normal speech and language. No gross focal neurologic deficits are appreciated. Speech is normal.  Skin:  Skin is warm, dry and intact. No rash noted. Psychiatric: Mood and affect are normal. Speech and behavior are normal. Patient exhibits appropriate insight and  judgment.  ____________________________________________    LABS (pertinent positives/negatives)  Labs Reviewed  CBC - Abnormal; Notable for the following:    Hemoglobin 11.9 (*)    All other components within normal limits  URINALYSIS COMPLETEWITH MICROSCOPIC ([facility] ONLY) - Abnormal; Notable for the following:    Color, Urine YELLOW (*)    APPearance CLEAR (*)    Hgb urine dipstick 3+ (*)    Bacteria, UA RARE (*)    Squamous Epithelial / LPF 0-5 (*)    All other components within normal limits     ____________________________________________   EKG  None  ____________________________________________    RADIOLOGY  Transvaginal US: IMPRESSION: Living twin intrauterine gestation with estimated gestational age 769 weeks 5 days for estimated date of delivery 02/14/2015. Previous small subchorionic hemorrhage is no longer seen.  ____________________________________________   PROCEDURES  Procedure(s) performed: None  Critical Care performed: No  ____________________________________________   INITIAL IMPRESSION / ASSESSMENT AND PLAN / ED COURSE  Pertinent labs & imaging results that were available during my care of the patient were reviewed by me and considered in my medical decision making (see chart for details).  She presented to the emergency department with vaginal bleeding. Ultrasound without any concerning findings. Patient states she recently underwent a pelvic exam with negative results. This point discussed with patient follow-up precautions. Patient states shortly has OB/GYN follow-up appointment scheduled.  ____________________________________________   FINAL CLINICAL IMPRESSION(S) / ED DIAGNOSES  Final diagnoses:  Threatened abortion  Pregnancy     Phineas SemenGraydon Ruey Storer, MD 07/19/14 [phone]

## 2014-07-19 NOTE — Discharge Instructions (Signed)
Please seek medical attention for any high fevers, chest pain, shortness of breath, change in behavior, persistent vomiting, bloody stool or any other new or concerning symptoms. ° °First Trimester of Pregnancy °The first trimester of pregnancy is from week 1 until the end of week 12 (months 1 through 3). A week after a sperm fertilizes an egg, the egg will implant on the wall of the uterus. This embryo will begin to develop into a baby. Genes from you and your partner are forming the baby. The female genes determine whether the baby is a boy or a girl. At 6-8 weeks, the eyes and face are formed, and the heartbeat can be seen on ultrasound. At the end of 12 weeks, all the baby's organs are formed.  °Now that you are pregnant, you will want to do everything you can to have a healthy baby. Two of the most important things are to get good prenatal care and to follow your health care provider's instructions. Prenatal care is all the medical care you receive before the baby's birth. This care will help prevent, find, and treat any problems during the pregnancy and childbirth. °BODY CHANGES °Your body goes through many changes during pregnancy. The changes vary from woman to woman.  °· You may gain or lose a couple of pounds at first. °· You may feel sick to your stomach (nauseous) and throw up (vomit). If the vomiting is uncontrollable, call your health care provider. °· You may tire easily. °· You may develop headaches that can be relieved by medicines approved by your health care provider. °· You may urinate more often. Painful urination may mean you have a bladder infection. °· You may develop heartburn as a result of your pregnancy. °· You may develop constipation because certain hormones are causing the muscles that push waste through your intestines to slow down. °· You may develop hemorrhoids or swollen, bulging veins (varicose veins). °· Your breasts may begin to grow larger and become tender. Your nipples may  stick out more, and the tissue that surrounds them (areola) may become darker. °· Your gums may bleed and may be sensitive to brushing and flossing. °· Dark spots or blotches (chloasma, mask of pregnancy) may develop on your face. This will likely fade after the baby is born. °· Your menstrual periods will stop. °· You may have a loss of appetite. °· You may develop cravings for certain kinds of food. °· You may have changes in your emotions from day to day, such as being excited to be pregnant or being concerned that something may go wrong with the pregnancy and baby. °· You may have more vivid and strange dreams. °· You may have changes in your hair. These can include thickening of your hair, rapid growth, and changes in texture. Some women also have hair loss during or after pregnancy, or hair that feels dry or thin. Your hair will most likely return to normal after your baby is born. °WHAT TO EXPECT AT YOUR PRENATAL VISITS °During a routine prenatal visit: °· You will be weighed to make sure you and the baby are growing normally. °· Your blood pressure will be taken. °· Your abdomen will be measured to track your baby's growth. °· The fetal heartbeat will be listened to starting around week 10 or 12 of your pregnancy. °· Test results from any previous visits will be discussed. °Your health care provider may ask you: °· How you are feeling. °· If you are feeling the   baby move. °· If you have had any abnormal symptoms, such as leaking fluid, bleeding, severe headaches, or abdominal cramping. °· If you have any questions. °Other tests that may be performed during your first trimester include: °· Blood tests to find your blood type and to check for the presence of any previous infections. They will also be used to check for low iron levels (anemia) and Rh antibodies. Later in the pregnancy, blood tests for diabetes will be done along with other tests if problems develop. °· Urine tests to check for infections,  diabetes, or protein in the urine. °· An ultrasound to confirm the proper growth and development of the baby. °· An amniocentesis to check for possible genetic problems. °· Fetal screens for spina bifida and Down syndrome. °· You may need other tests to make sure you and the baby are doing well. °HOME CARE INSTRUCTIONS  °Medicines °· Follow your health care provider's instructions regarding medicine use. Specific medicines may be either safe or unsafe to take during pregnancy. °· Take your prenatal vitamins as directed. °· If you develop constipation, try taking a stool softener if your health care provider approves. °Diet °· Eat regular, well-balanced meals. Choose a variety of foods, such as meat or vegetable-based protein, fish, milk and low-fat dairy products, vegetables, fruits, and whole grain breads and cereals. Your health care provider will help you determine the amount of weight gain that is right for you. °· Avoid raw meat and uncooked cheese. These carry germs that can cause birth defects in the baby. °· Eating four or five small meals rather than three large meals a day may help relieve nausea and vomiting. If you start to feel nauseous, eating a few soda crackers can be helpful. Drinking liquids between meals instead of during meals also seems to help nausea and vomiting. °· If you develop constipation, eat more high-fiber foods, such as fresh vegetables or fruit and whole grains. Drink enough fluids to keep your urine clear or pale yellow. °Activity and Exercise °· Exercise only as directed by your health care provider. Exercising will help you: °¨ Control your weight. °¨ Stay in shape. °¨ Be prepared for labor and delivery. °· Experiencing pain or cramping in the lower abdomen or low back is a good sign that you should stop exercising. Check with your health care provider before continuing normal exercises. °· Try to avoid standing for long periods of time. Move your legs often if you must stand in  one place for a long time. °· Avoid heavy lifting. °· Wear low-heeled shoes, and practice good posture. °· You may continue to have sex unless your health care provider directs you otherwise. °Relief of Pain or Discomfort °· Wear a good support bra for breast tenderness.   °· Take warm sitz baths to soothe any pain or discomfort caused by hemorrhoids. Use hemorrhoid cream if your health care provider approves.   °· Rest with your legs elevated if you have leg cramps or low back pain. °· If you develop varicose veins in your legs, wear support hose. Elevate your feet for 15 minutes, 3-4 times a day. Limit salt in your diet. °Prenatal Care °· Schedule your prenatal visits by the twelfth week of pregnancy. They are usually scheduled monthly at first, then more often in the last 2 months before delivery. °· Write down your questions. Take them to your prenatal visits. °· Keep all your prenatal visits as directed by your health care provider. °Safety °· Wear your seat   belt at all times when driving. °· Make a list of emergency phone numbers, including numbers for family, friends, the hospital, and police and fire departments. °General Tips °· Ask your health care provider for a referral to a local prenatal education class. Begin classes no later than at the beginning of month 6 of your pregnancy. °· Ask for help if you have counseling or nutritional needs during pregnancy. Your health care provider can offer advice or refer you to specialists for help with various needs. °· Do not use hot tubs, steam rooms, or saunas. °· Do not douche or use tampons or scented sanitary pads. °· Do not cross your legs for long periods of time. °· Avoid cat litter boxes and soil used by cats. These carry germs that can cause birth defects in the baby and possibly loss of the fetus by miscarriage or stillbirth. °· Avoid all smoking, herbs, alcohol, and medicines not prescribed by your health care provider. Chemicals in these affect the  formation and growth of the baby. °· Schedule a dentist appointment. At home, brush your teeth with a soft toothbrush and be gentle when you floss. °SEEK MEDICAL CARE IF:  °· You have dizziness. °· You have mild pelvic cramps, pelvic pressure, or nagging pain in the abdominal area. °· You have persistent nausea, vomiting, or diarrhea. °· You have a bad smelling vaginal discharge. °· You have pain with urination. °· You notice increased swelling in your face, hands, legs, or ankles. °SEEK IMMEDIATE MEDICAL CARE IF:  °· You have a fever. °· You are leaking fluid from your vagina. °· You have spotting or bleeding from your vagina. °· You have severe abdominal cramping or pain. °· You have rapid weight gain or loss. °· You vomit blood or material that looks like coffee grounds. °· You are exposed to German measles and have never had them. °· You are exposed to fifth disease or chickenpox. °· You develop a severe headache. °· You have shortness of breath. °· You have any kind of trauma, such as from a fall or a car accident. °Document Released: 01/26/2001 Document Revised: 06/18/2013 Document Reviewed: 12/12/2012 °ExitCare® Patient Information ©2015 [employer]. This information is not intended to replace advice given to you by your health care provider. Make sure you discuss any questions you have with your health care provider. ° °

## 2014-07-20 LAB — URINE CULTURE

## 2014-09-02 ENCOUNTER — Encounter: Payer: Self-pay | Admitting: Emergency Medicine

## 2014-09-02 ENCOUNTER — Emergency Department
Admission: EM | Admit: 2014-09-02 | Discharge: 2014-09-02 | Disposition: A | Discharge status: Home / Self Care | Payer: No Typology Code available for payment source | Attending: Emergency Medicine | Admitting: Emergency Medicine

## 2014-09-02 DIAGNOSIS — R42 Dizziness and giddiness: Secondary | ICD-10-CM | POA: Insufficient documentation

## 2014-09-02 DIAGNOSIS — K5900 Constipation, unspecified: Secondary | ICD-10-CM | POA: Insufficient documentation

## 2014-09-02 DIAGNOSIS — Z3A16 16 weeks gestation of pregnancy: Secondary | ICD-10-CM | POA: Insufficient documentation

## 2014-09-02 DIAGNOSIS — O24112 Pre-existing diabetes mellitus, type 2, in pregnancy, second trimester: Secondary | ICD-10-CM | POA: Insufficient documentation

## 2014-09-02 DIAGNOSIS — E119 Type 2 diabetes mellitus without complications: Secondary | ICD-10-CM | POA: Diagnosis not present

## 2014-09-02 DIAGNOSIS — O99612 Diseases of the digestive system complicating pregnancy, second trimester: Secondary | ICD-10-CM | POA: Diagnosis present

## 2014-09-02 DIAGNOSIS — O9989 Other specified diseases and conditions complicating pregnancy, childbirth and the puerperium: Secondary | ICD-10-CM | POA: Diagnosis not present

## 2014-09-02 DIAGNOSIS — Z79899 Other long term (current) drug therapy: Secondary | ICD-10-CM | POA: Diagnosis not present

## 2014-09-02 MED ORDER — POLYETHYLENE GLYCOL 3350 17 G PO PACK: 17.0000 g | PACK | Freq: Every day | ORAL | Status: DC

## 2014-09-02 MED ORDER — SODIUM CHLORIDE 0.9 % IV SOLN
Freq: Once | INTRAVENOUS | Status: AC
  Administered 2014-09-02: 17:00:00 via INTRAVENOUS

## 2014-09-02 MED ORDER — ONDANSETRON HCL 4 MG/2ML IJ SOLN
4.0000 mg | Freq: Once | INTRAMUSCULAR | Status: AC
Start: 2014-09-02 — End: 2014-09-02
  Administered 2014-09-02: 4 mg via INTRAVENOUS

## 2014-09-02 MED ORDER — ONDANSETRON HCL 4 MG/2ML IJ SOLN
INTRAMUSCULAR | Status: AC
  Administered 2014-09-02: 4 mg via INTRAVENOUS
  Filled 2014-09-02: qty 2

## 2014-09-02 NOTE — ED Provider Notes (Signed)
[facility] Emergency Department Provider Note  ____________________________________________  Time seen: On arrival  I have reviewed the triage vital signs and the nursing notes.   HISTORY  Chief Complaint Constipation    HPI Meredith Rasmussen is a 29 y.o. female who is [redacted] weeks pregnant with a twin gestation. She complains of mild constipation and feeling bloated for 2 days. She states specifically denies abdominal pain and has no vaginal bleeding. She reports that he is making her feel a little bit dizzy as well. She denies nausea or vomiting     Past Medical History  Diagnosis Date  . Diabetes mellitus without complication   . Ectopic pregnancy     There are no active problems to display for this patient.   Past Surgical History  Procedure Laterality Date  . Ectopic pregnancy surgery      Current Outpatient Rx  Name  Route  Sig  Dispense  Refill  . metFORMIN (GLUCOPHAGE) 1000 MG tablet   Oral   Take 1,000 mg by mouth 2 (two) times daily with a meal.         . polyethylene glycol (MIRALAX) packet   Oral   Take 17 g by mouth daily.   14 each   0   . Prenatal Vit-Fe Fumarate-FA (MULTIVITAMIN-PRENATAL) 27-0.8 MG TABS tablet   Oral   Take 1 tablet by mouth daily at 12 noon.           Allergies Review of patient's allergies indicates no known allergies.  No family history on file.  Social History History  Substance Use Topics  . Smoking status: Never Smoker   . Smokeless tobacco: Never Used  . Alcohol Use: No    Review of Systems  Constitutional: Negative for fever. Eyes: Negative for visual changes. ENT: Negative for sore throat Cardiovascular: Negative for chest pain. Respiratory: Negative for shortness of breath. Gastrointestinal: Negative for abdominal pain, vomiting and diarrhea. Positive for constipation Genitourinary: Negative for dysuria. Musculoskeletal: Negative for back pain. Skin: Negative for  rash. Neurological: Negative for headaches or focal weakness. Positive for dizziness Psychiatric: No anxiety  10-point ROS otherwise negative.  ____________________________________________   PHYSICAL EXAM:  VITAL SIGNS: ED Triage Vitals  Enc Vitals Group     BP 09/02/14 1441 111/57 mmHg     Pulse Rate 09/02/14 1441 104     Resp 09/02/14 1441 18     Temp 09/02/14 1444 98.2 F (36.8 C)     Temp Source 09/02/14 1444 Oral     SpO2 09/02/14 1441 97 %     Weight 09/02/14 1441 175 lb (79.379 kg)     Height 09/02/14 1441 5' (1.524 m)     Head Cir --      Peak Flow --      Pain Score 09/02/14 1442 4     Pain Loc --      Pain Edu? --      Excl. in GC? --      Constitutional: Alert and oriented. Well appearing and in no distress. Eyes: Conjunctivae are normal.  ENT   Head: Normocephalic and atraumatic.   Mouth/Throat: Mucous membranes are moist. Cardiovascular: Normal rate, regular rhythm. Normal and symmetric distal pulses are present in all extremities.  Respiratory: Normal respiratory effort without tachypnea nor retractions. Gastrointestinal: Soft and non-tender in all quadrants. No distention. There is no CVA tenderness. Genitourinary: deferred Musculoskeletal: Nontender with normal range of motion in all extremities. No lower extremity tenderness nor edema.  Neurologic:  Normal speech and language. No gross focal neurologic deficits are appreciated. Skin:  Skin is warm, dry and intact. No rash noted. Psychiatric: Mood and affect are normal. Patient exhibits appropriate insight and judgment.  ____________________________________________    LABS (pertinent positives/negatives)  Labs Reviewed - No data to display  ____________________________________________   EKG  None  ____________________________________________    RADIOLOGY I have personally reviewed any xrays that were ordered on this  patient: None  ____________________________________________   PROCEDURES  Procedure(s) performed: none  Critical Care performed: none  ____________________________________________   INITIAL IMPRESSION / ASSESSMENT AND PLAN / ED COURSE  Pertinent labs & imaging results that were available during my care of the patient were reviewed by me and considered in my medical decision making (see chart for details).  Patient given normal saline 1 L IV with improvement in her heart rate. She feels significantly better and has no more dizziness. We will Rx MiraLAX for constipation. Patient to follow-up with her gynecologist. Return precautions discussed with patient and she agrees to return if any abdominal pain or vaginal bleeding  ____________________________________________   FINAL CLINICAL IMPRESSION(S) / ED DIAGNOSES  Final diagnoses:  Constipation, unspecified constipation type     Meredith Everyobert Zayonna Ayuso, MD 09/02/14 Meredith [id number]

## 2014-09-02 NOTE — ED Notes (Signed)
Pt informed to return if any life threatening symptoms occur.  

## 2014-09-02 NOTE — Discharge Instructions (Signed)

## 2014-09-02 NOTE — ED Notes (Signed)
Pt presents with abd pain and constipation for two days. Pt denies any vaginal bleeding.

## 2014-10-16 ENCOUNTER — Observation Stay
Admission: EM | Admit: 2014-10-16 | Discharge: 2014-10-16 | Disposition: A | Discharge status: Home / Self Care | Payer: No Typology Code available for payment source | Attending: Obstetrics and Gynecology | Admitting: Obstetrics and Gynecology

## 2014-10-16 DIAGNOSIS — E119 Type 2 diabetes mellitus without complications: Secondary | ICD-10-CM | POA: Insufficient documentation

## 2014-10-16 DIAGNOSIS — R109 Unspecified abdominal pain: Secondary | ICD-10-CM | POA: Diagnosis present

## 2014-10-16 DIAGNOSIS — O26892 Other specified pregnancy related conditions, second trimester: Secondary | ICD-10-CM | POA: Diagnosis not present

## 2014-10-16 DIAGNOSIS — O24112 Pre-existing diabetes mellitus, type 2, in pregnancy, second trimester: Secondary | ICD-10-CM | POA: Diagnosis not present

## 2014-10-16 DIAGNOSIS — O30042 Twin pregnancy, dichorionic/diamniotic, second trimester: Secondary | ICD-10-CM | POA: Insufficient documentation

## 2014-10-16 DIAGNOSIS — Z3A22 22 weeks gestation of pregnancy: Secondary | ICD-10-CM | POA: Insufficient documentation

## 2014-10-16 LAB — URINALYSIS COMPLETE WITH MICROSCOPIC (ARMC ONLY)
Bacteria, UA: NONE SEEN
Bilirubin Urine: NEGATIVE
Glucose, UA: NEGATIVE mg/dL
Hgb urine dipstick: NEGATIVE
Leukocytes, UA: NEGATIVE
Nitrite: NEGATIVE
PH: 6 (ref 5.0–8.0)
PROTEIN: NEGATIVE mg/dL
Specific Gravity, Urine: 1.009 (ref 1.005–1.030)

## 2014-10-16 LAB — CBC
HCT: 30.4 % — ABNORMAL LOW (ref 35.0–47.0)
HEMOGLOBIN: 10.2 g/dL — AB (ref 12.0–16.0)
MCH: 28.5 pg (ref 26.0–34.0)
MCHC: 33.6 g/dL (ref 32.0–36.0)
MCV: 84.8 fL (ref 80.0–100.0)
Platelets: 238 10*3/uL (ref 150–440)
RBC: 3.58 MIL/uL — AB (ref 3.80–5.20)
RDW: 13.5 % (ref 11.5–14.5)
WBC: 6.2 10*3/uL (ref 3.6–11.0)

## 2014-10-16 NOTE — H&P (Signed)
Obstetric H&P   Chief Complaint: Abdominal pain  Prenatal Care Provider: [facility]  History of Present Illness: 29 y.o. [phone] with a Di/Di twin pregnancy at [redacted]w[redacted]d presenting to L&D for abdominal pain.  Pain started today, did not go to work secondary to pain.  +FM, no LOF, no VB.  Her pregnancy thus far has been complicated by Type II DM managed with metformin, Di/Di twins, and obesity.   Review of Systems: 10 point review of systems negative unless otherwise noted in HPI  Past Medical History: Past Medical History  Diagnosis Date  . Diabetes mellitus without complication   . Ectopic pregnancy     Past Surgical History: Past Surgical History  Procedure Laterality Date  . Ectopic pregnancy surgery     Family History: No family history on file.  Social History: Social History   Social History  . Marital Status: Single    Spouse Name: N/A  . Number of Children: N/A  . Years of Education: N/A   Occupational History  . Not on file.   Social History Main Topics  . Smoking status: Never Smoker   . Smokeless tobacco: Never Used  . Alcohol Use: No  . Drug Use: No  . Sexual Activity: Yes   Other Topics Concern  . Not on file   Social History Narrative    Medications: Prior to Admission medications   Medication Sig Start Date End Date Taking? Authorizing Provider  metFORMIN (GLUCOPHAGE) 1000 MG tablet Take 1,000 mg by mouth 2 (two) times daily with a meal.    Historical Provider, MD  polyethylene glycol (MIRALAX) packet Take 17 g by mouth daily. 09/02/14   Jene Every, MD  Prenatal Vit-Fe Fumarate-FA (MULTIVITAMIN-PRENATAL) 27-0.8 MG TABS tablet Take 1 tablet by mouth daily at 12 noon.    Historical Provider, MD    Allergies: No Known Allergies  Physical Exam: Vitals: Blood pressure 111/63, temperature 98.3 F (36.8 C), temperature source Oral, resp. rate 18, last menstrual period 05/02/2014.  Urine Dip Protein: negative  FHT: 125, moderate, no accels, no  decels          130, moderate, no accels, no decels  Toco: absent  General: NAD HEENT: normocephalic, anicteric Pulmonary: no increased work of breathing Abdomen: Gravid, non-tender Genitourinary: closed/thick/high Extremities: no edema  Labs: Results for orders placed or performed during the hospital encounter of 10/16/14 (from the past 24 hour(s))  Urinalysis complete, with microscopic ([facility] only)     Status: Abnormal   Collection Time: 10/16/14 12:19 PM  Result Value Ref Range   Color, Urine YELLOW (A) YELLOW   APPearance CLEAR (A) CLEAR   Glucose, UA NEGATIVE NEGATIVE mg/dL   Bilirubin Urine NEGATIVE NEGATIVE   Ketones, ur TRACE (A) NEGATIVE mg/dL   Specific Gravity, Urine 1.009 1.005 - 1.030   Hgb urine dipstick NEGATIVE NEGATIVE   pH 6.0 5.0 - 8.0   Protein, ur NEGATIVE NEGATIVE mg/dL   Nitrite NEGATIVE NEGATIVE   Leukocytes, UA NEGATIVE NEGATIVE   RBC / HPF 0-5 0 - 5 RBC/hpf   WBC, UA 0-5 0 - 5 WBC/hpf   Bacteria, UA NONE SEEN NONE SEEN   Squamous Epithelial / LPF 0-5 (A) NONE SEEN   Mucous PRESENT   CBC     Status: Abnormal   Collection Time: 10/16/14 12:31 PM  Result Value Ref Range   WBC 6.2 3.6 - 11.0 K/uL   RBC 3.58 (L) 3.80 - 5.20 MIL/uL   Hemoglobin 10.2 (L) 12.0 - 16.0 g/dL  HCT 30.4 (L) 35.0 - 47.0 %   MCV 84.8 80.0 - 100.0 fL   MCH 28.5 26.0 - 34.0 pg   MCHC 33.6 32.0 - 36.0 g/dL   RDW 56.2 13.0 - 86.5 %   Platelets 238 150 - 440 K/uL    Assessment: 29 y.o. G1P0 [redacted]w[redacted]d with discomforts of pregnancy  Plan: 1) Abdominal pain - benign exam, normal CBC and UA.  Cervix closed with negative tocometer.    2) Fetus - +FHT's, previable  3) Disposition - discharge home has follow up at [facility] on 10/25/14 with follow up ultrasound that visit

## 2014-10-16 NOTE — OB Triage Note (Signed)
Received pt to [facility] for c/o abd pain- Pt is currently at pt at [facility] [place]. While at work this am pt felt pain to upper abd and now is in lower abd. Rates pain 5/10.  No vaginal bleeding noted, History of Diabetes. Currently takes Metformin  po Bid, PNV QD, Iron . Pt is G4P1- twin gest.   Pt has not ate or drunk since last pm/supper.  States she usually eats at work and didn't have time.

## 2014-12-01 ENCOUNTER — Observation Stay
Admission: EM | Admit: 2014-12-01 | Discharge: 2014-12-01 | Disposition: A | Discharge status: Home / Self Care | Payer: No Typology Code available for payment source | Attending: Obstetrics & Gynecology | Admitting: Obstetrics & Gynecology

## 2014-12-01 ENCOUNTER — Encounter: Payer: Self-pay | Admitting: *Deleted

## 2014-12-01 DIAGNOSIS — R1031 Right lower quadrant pain: Secondary | ICD-10-CM | POA: Diagnosis present

## 2014-12-01 DIAGNOSIS — R102 Pelvic and perineal pain: Secondary | ICD-10-CM | POA: Diagnosis not present

## 2014-12-01 DIAGNOSIS — O30003 Twin pregnancy, unspecified number of placenta and unspecified number of amniotic sacs, third trimester: Secondary | ICD-10-CM | POA: Diagnosis not present

## 2014-12-01 DIAGNOSIS — Z3A29 29 weeks gestation of pregnancy: Secondary | ICD-10-CM | POA: Insufficient documentation

## 2014-12-01 DIAGNOSIS — O26892 Other specified pregnancy related conditions, second trimester: Principal | ICD-10-CM | POA: Insufficient documentation

## 2014-12-01 MED ORDER — ACETAMINOPHEN 325 MG PO TABS: 650.0000 mg | ORAL_TABLET | ORAL | Status: DC | PRN

## 2014-12-01 NOTE — OB Triage Note (Signed)
Patient complains of pain in right groin. Reports starting @ 2200 last night. Reports pain comes and goes. Meredith Rasmussen, [name]

## 2014-12-01 NOTE — Discharge Summary (Signed)
Meredith Rasmussen is a 29 y.o. female. [id number] She is at 3354w3d gestation.  Indication: right groin pain  S: Resting comfortably. no CTX, no VB. Active fetal movement. Concerned about aching and occasionally sharp shooting pain in her right groin.  No back pain, dysuria, fever, chills.  +FMx2  O:  BP 97/56 mmHg  Pulse 113  Temp(Src) 98.5 F (36.9 C) (Oral)  Resp 20  LMP 05/09/2014  Gen: NAD, AAOx3      Abd: FNTTP      Ext: Non-tender, Nonedmeatous    XLK:GMWNFHT:baby A 140, baby b 130 TOCO: quiet SVE: deferred   A/P:  28yo [id number] @ 29.3 with twins, and round ligament pain   Labor: not present. .   Fetal Wellbeing: Reassuring Cat 1 tracing x2.  D/c home stable, precautions reviewed, follow-up as scheduled.   Phuoc Huy, Elenora Fenderhelsea C

## 2014-12-21 ENCOUNTER — Observation Stay
Admission: EM | Admit: 2014-12-21 | Discharge: 2014-12-21 | Disposition: A | Discharge status: Home / Self Care | Payer: No Typology Code available for payment source | Attending: Obstetrics and Gynecology | Admitting: Obstetrics and Gynecology

## 2014-12-21 ENCOUNTER — Encounter: Payer: Self-pay | Admitting: *Deleted

## 2014-12-21 DIAGNOSIS — O24113 Pre-existing diabetes mellitus, type 2, in pregnancy, third trimester: Secondary | ICD-10-CM | POA: Diagnosis not present

## 2014-12-21 DIAGNOSIS — O30043 Twin pregnancy, dichorionic/diamniotic, third trimester: Secondary | ICD-10-CM | POA: Insufficient documentation

## 2014-12-21 DIAGNOSIS — Z3A32 32 weeks gestation of pregnancy: Secondary | ICD-10-CM | POA: Insufficient documentation

## 2014-12-21 DIAGNOSIS — E119 Type 2 diabetes mellitus without complications: Secondary | ICD-10-CM | POA: Insufficient documentation

## 2014-12-21 DIAGNOSIS — O4693 Antepartum hemorrhage, unspecified, third trimester: Secondary | ICD-10-CM | POA: Diagnosis present

## 2014-12-21 LAB — URINALYSIS COMPLETE WITH MICROSCOPIC (ARMC ONLY)
BACTERIA UA: NONE SEEN
BILIRUBIN URINE: NEGATIVE
GLUCOSE, UA: NEGATIVE mg/dL
Hgb urine dipstick: NEGATIVE
KETONES UR: NEGATIVE mg/dL
LEUKOCYTES UA: NEGATIVE
Nitrite: NEGATIVE
Protein, ur: NEGATIVE mg/dL
RBC / HPF: NONE SEEN RBC/hpf (ref 0–5)
Specific Gravity, Urine: 1.004 — ABNORMAL LOW (ref 1.005–1.030)
WBC, UA: NONE SEEN WBC/hpf (ref 0–5)
pH: 7 (ref 5.0–8.0)

## 2014-12-21 LAB — URINE DRUG SCREEN, QUALITATIVE (ARMC ONLY)
Amphetamines, Ur Screen: NOT DETECTED
BARBITURATES, UR SCREEN: NOT DETECTED
BENZODIAZEPINE, UR SCRN: NOT DETECTED
CANNABINOID 50 NG, UR ~~LOC~~: NOT DETECTED
COCAINE METABOLITE, UR ~~LOC~~: NOT DETECTED
MDMA (Ecstasy)Ur Screen: NOT DETECTED
METHADONE SCREEN, URINE: NOT DETECTED
OPIATE, UR SCREEN: NOT DETECTED
PHENCYCLIDINE (PCP) UR S: NOT DETECTED
Tricyclic, Ur Screen: NOT DETECTED

## 2014-12-21 LAB — WET PREP, GENITAL
CLUE CELLS WET PREP: NONE SEEN
TRICH WET PREP: NONE SEEN
WBC, Wet Prep HPF POC: NONE SEEN
YEAST WET PREP: NONE SEEN

## 2014-12-21 LAB — CHLAMYDIA/NGC RT PCR (ARMC ONLY)
CHLAMYDIA TR: NOT DETECTED
N gonorrhoeae: NOT DETECTED

## 2014-12-21 NOTE — OB Triage Note (Signed)
Complaining of minimal bleeding on toilet paper after using the bathroom x3 that started this AM. No leaking fluid. Positive fetal movement, but decreased from Baby A.

## 2014-12-21 NOTE — Final Progress Note (Addendum)
History and Physical  Meredith Rasmussen is a 29 y.o. female. [id number] She is at 1457w2d gestation. Her pregnancy is complicated by type 2 diabetes mellitus and di/di twin gestation.  Indication: vaginal bleedig  S: Patient had two episodes of light pink spotting on the napkin after wiping post void today.  She had one episode earlier this morning and one early this afternoon.  She notes +FM x2, no ctx, no LOF.  She had intercourse between episodes of spotting. She denies abdominal pain, urinary symptoms, and any other vaginal symptoms.  O:  AFVSS  Gen: NAD, AAOx3  Abd: FNTTP Ext: Non-tender, Nonedmeatous  FHT: baby A 125 / mod var/ +accels/ no decels baby b 135 / mod var/ + accels/ no decels TOCO: quiet (two contraction entire time she was here) SVE: NEFG, normal BSUS, no lesion, no blood in vaginal vault or coming from cervix.    BSUS: twins vtx/vtx both active with lots of movement and breathing motion.    A/P: 28yo [id number] @ 1757w2d with twins, and faint vaginal spotting from an unknown source. Likely transient. No evidence of abruption.  Labor: not present. .   Fetal Wellbeing: Reassuring Cat 1 tracing x2.  D/c home stable, precautions reviewed, follow-up as scheduled in 3 days   Thomasene MohairStephen Carnita Golob, MD 12/21/2014 9:57 PM

## 2014-12-21 NOTE — Discharge Summary (Signed)
Reviewed discharge instructions with patient and significant other including signs of rupture, heavy bleeding, regular contractions, decreased fetal movement, and signs of pre-eclampsia. Ambulatory and stable on discharge.

## 2015-01-17 ENCOUNTER — Observation Stay
Admission: EM | Admit: 2015-01-17 | Discharge: 2015-01-17 | Disposition: A | Discharge status: Home / Self Care | Payer: No Typology Code available for payment source | Attending: Obstetrics and Gynecology | Admitting: Obstetrics and Gynecology

## 2015-01-17 ENCOUNTER — Encounter: Payer: Self-pay | Admitting: *Deleted

## 2015-01-17 DIAGNOSIS — O24113 Pre-existing diabetes mellitus, type 2, in pregnancy, third trimester: Secondary | ICD-10-CM | POA: Insufficient documentation

## 2015-01-17 DIAGNOSIS — O479 False labor, unspecified: Secondary | ICD-10-CM | POA: Diagnosis present

## 2015-01-17 DIAGNOSIS — Z3A36 36 weeks gestation of pregnancy: Secondary | ICD-10-CM | POA: Diagnosis not present

## 2015-01-17 DIAGNOSIS — Z91410 Personal history of adult physical and sexual abuse: Secondary | ICD-10-CM | POA: Insufficient documentation

## 2015-01-17 DIAGNOSIS — E119 Type 2 diabetes mellitus without complications: Secondary | ICD-10-CM | POA: Diagnosis not present

## 2015-01-17 DIAGNOSIS — O30043 Twin pregnancy, dichorionic/diamniotic, third trimester: Secondary | ICD-10-CM | POA: Insufficient documentation
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
## 2015-01-17 NOTE — Discharge Instructions (Signed)
AVS discharge instructions and labor/pregnancy precautions given to patient with no questions or concerns.   Pt states feels safe to go home tonight.  Meredith Rasmussen F
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
## 2015-10-06 ENCOUNTER — Encounter (HOSPITAL_COMMUNITY): Payer: Self-pay

## 2016-01-08 ENCOUNTER — Emergency Department
Admission: EM | Admit: 2016-01-08 | Discharge: 2016-01-08 | Disposition: A | Discharge status: Home / Self Care | Payer: No Typology Code available for payment source | Attending: Emergency Medicine | Admitting: Emergency Medicine

## 2016-01-08 ENCOUNTER — Encounter: Payer: Self-pay | Admitting: Intensive Care

## 2016-01-08 DIAGNOSIS — E119 Type 2 diabetes mellitus without complications: Secondary | ICD-10-CM | POA: Insufficient documentation

## 2016-01-08 DIAGNOSIS — K047 Periapical abscess without sinus: Secondary | ICD-10-CM | POA: Insufficient documentation

## 2016-01-08 DIAGNOSIS — Z7984 Long term (current) use of oral hypoglycemic drugs: Secondary | ICD-10-CM | POA: Insufficient documentation

## 2016-01-08 DIAGNOSIS — K0889 Other specified disorders of teeth and supporting structures: Secondary | ICD-10-CM

## 2016-01-08 MED ORDER — ACETAMINOPHEN 500 MG PO TABS
1000.0000 mg | ORAL_TABLET | Freq: Once | ORAL | Status: AC
  Administered 2016-01-08: 1000 mg via ORAL

## 2016-01-08 MED ORDER — DEXAMETHASONE SODIUM PHOSPHATE 4 MG/ML IJ SOLN
10.0000 mg | Freq: Once | INTRAMUSCULAR | Status: AC
  Administered 2016-01-08: 10 mg via INTRAMUSCULAR
  Filled 2016-01-08: qty 3

## 2016-01-08 MED ORDER — ACETAMINOPHEN 500 MG PO TABS
ORAL_TABLET | ORAL | Status: AC
  Filled 2016-01-08: qty 2

## 2016-01-08 MED ORDER — ACETAMINOPHEN 500 MG PO TABS: 1000.0000 mg | ORAL_TABLET | Freq: Once | ORAL | Status: DC

## 2016-01-08 MED ORDER — AMOXICILLIN 500 MG PO TABS: 500.0000 mg | ORAL_TABLET | Freq: Three times a day (TID) | ORAL | 0 refills | Status: DC

## 2016-01-08 MED ORDER — IBUPROFEN 600 MG PO TABS: 600.0000 mg | ORAL_TABLET | Freq: Four times a day (QID) | ORAL | 0 refills | Status: DC | PRN

## 2016-01-08 MED ORDER — AMOXICILLIN 500 MG PO CAPS
1000.0000 mg | ORAL_CAPSULE | Freq: Once | ORAL | Status: AC
  Administered 2016-01-08: 1000 mg via ORAL
  Filled 2016-01-08: qty 2

## 2016-01-08 MED ORDER — LIDOCAINE VISCOUS 2 % MT SOLN: 10.0000 mL | OROMUCOSAL | 0 refills | Status: DC | PRN

## 2016-01-08 NOTE — ED Provider Notes (Signed)
[facility] Emergency Department Provider Note  ____________________________________________  Time seen: Approximately 6:32 PM  I have reviewed the triage vital signs and the nursing notes.   HISTORY  Chief Complaint Dental Pain    HPI Nance Pewrica D Lama is a 30 y.o. female , NAD, presents to emergency with 2 day history of right lower dental pain. States she had pain and swelling along with an infection about this area of her mouth approximately one year ago. Was unable to see a dentist as she was pregnant at that time. States she had pain began about the right lower dental area yesterday that has worsened through the day today. Notes that she can no longer feel the wisdom tooth in that area due to swelling. Denies any fevers, chills or body aches. Has not had any neck pain, lightheadedness or dizziness. Denies any abdominal pain, nausea or vomiting. Has had no injury or trauma to the area. Has not noted any oozing or weeping from the swollen site.   Past Medical History:  Diagnosis Date  . Diabetes mellitus without complication (HCC)   . Ectopic pregnancy     Patient Active Problem List   Diagnosis Date Noted  . Irregular contractions 01/17/2015  . Labor and delivery, indication for care 12/21/2014  . Labor and delivery indication for care or intervention 12/01/2014  . Abdominal pain 10/16/2014    Past Surgical History:  Procedure Laterality Date  . ECTOPIC PREGNANCY SURGERY      Prior to Admission medications   Medication Sig Start Date End Date Taking? Authorizing Provider  amoxicillin (AMOXIL) 500 MG tablet Take 1 tablet (500 mg total) by mouth 3 (three) times daily with meals. 01/08/16   Jami L Hagler, PA-C  calcium carbonate (TUMS - DOSED IN MG ELEMENTAL CALCIUM) 500 MG chewable tablet Chew 1 tablet by mouth daily.    Historical Provider, MD  docusate sodium (COLACE) 100 MG capsule Take 100 mg by mouth 2 (two) times daily.    Historical Provider,  MD  ferrous fumarate (HEMOCYTE - 106 MG FE) 325 (106 FE) MG TABS tablet Take 1 tablet by mouth.    Historical Provider, MD  ibuprofen (ADVIL,MOTRIN) 600 MG tablet Take 1 tablet (600 mg total) by mouth every 6 (six) hours as needed. 01/08/16   Jami L Hagler, PA-C  lidocaine (XYLOCAINE) 2 % solution Use as directed 10 mLs in the mouth or throat every 4 (four) hours as needed for mouth pain. 01/08/16   Jami L Hagler, PA-C  metFORMIN (GLUCOPHAGE) 1000 MG tablet Take 1,000 mg by mouth 2 (two) times daily with a meal.    Historical Provider, MD  polyethylene glycol (MIRALAX) packet Take 17 g by mouth daily. Patient not taking: Reported on 12/01/2014 09/02/14   Jene Everyobert Kinner, MD  Prenatal Vit-Fe Fumarate-FA (MULTIVITAMIN-PRENATAL) 27-0.8 MG TABS tablet Take 1 tablet by mouth daily at 12 noon.    Historical Provider, MD    Allergies Patient has no known allergies.  History reviewed. No pertinent family history.  Social History Social History  Substance Use Topics  . Smoking status: Never Smoker  . Smokeless tobacco: Never Used  . Alcohol use No     Review of Systems  Constitutional: No fever/chills ENT: Positive right lower dental pain with gum line swelling. No difficulty swallowing or breathing. Cardiovascular: No chest pain. Respiratory: No shortness of breath. Gastrointestinal: No abdominal pain.  No nausea, vomiting.  Musculoskeletal: Negative for general myalgias Skin: Negative for rash, redness, oozing, weeping.  Neurological: Negative for headaches.   ____________________________________________   PHYSICAL EXAM:  VITAL SIGNS: ED Triage Vitals [01/08/16 1813]  Enc Vitals Group     BP 136/75     Pulse Rate 100     Resp 18     Temp (!) 101.3 F (38.5 C)     Temp Source Oral     SpO2 99 %     Weight 166 lb (75.3 kg)     Height 4\' 11"  (1.499 m)     Head Circumference      Peak Flow      Pain Score 6     Pain Loc      Pain Edu?      Excl. in GC?       Constitutional: Alert and oriented. Well appearing and in no acute distress. Eyes: Conjunctivae are normal.  Head: Atraumatic. ENT:      Nose: No congestion/rhinnorhea.      Mouth/Throat: Significant gumline swelling about the right lower posterior wisdom tooth with mild erythema but no oozing or weeping. Significant tenderness to palpation of this area. Mucous membranes are moist.  Neck: Supple with full range of motion. Hematological/Lymphatic/Immunilogical: No cervical lymphadenopathy. Cardiovascular:  Good peripheral circulation. Respiratory: Normal respiratory effort without tachypnea or retractions.  Neurologic:  Normal speech and language. No gross focal neurologic deficits are appreciated.  Skin:  Skin is warm, dry and intact. No rash noted. Psychiatric: Mood and affect are normal. Speech and behavior are normal. Patient exhibits appropriate insight and judgement.   ____________________________________________   LABS  None ____________________________________________  EKG  None ____________________________________________  RADIOLOGY  None ____________________________________________    PROCEDURES  Procedure(s) performed: None   Procedures   Medications  acetaminophen (TYLENOL) tablet 1,000 mg (1,000 mg Oral Given 01/08/16 1818)  dexamethasone (DECADRON) injection 10 mg (10 mg Intramuscular Given 01/08/16 1845)  amoxicillin (AMOXIL) capsule 1,000 mg (1,000 mg Oral Given 01/08/16 1844)     ____________________________________________   INITIAL IMPRESSION / ASSESSMENT AND PLAN / ED COURSE  Pertinent labs & imaging results that were available during my care of the patient were reviewed by me and considered in my medical decision making (see chart for details).  Clinical Course     Patient's diagnosis is consistent with Dental abscess. Patient was given Decadron injection as well as first dose of amoxicillin while in the emergency department and  tolerated well. Fever significantly decreased from 101.173F to 99.73F after administration of acetaminophen. Patient will be discharged home with prescriptions for amoxicillin, ibuprofen and lidocaine viscus to use as directed. Patient is to follow up with her dentist in one week for further evaluation and treatment of dental abscess and need for wisdom tooth removal. Patient is given ED precautions to return to the ED for any worsening or new symptoms.   ____________________________________________  FINAL CLINICAL IMPRESSION(S) / ED DIAGNOSES  Final diagnoses:  Dental abscess  Pain, dental      NEW MEDICATIONS STARTED DURING THIS VISIT:  New Prescriptions   AMOXICILLIN (AMOXIL) 500 MG TABLET    Take 1 tablet (500 mg total) by mouth 3 (three) times daily with meals.   IBUPROFEN (ADVIL,MOTRIN) 600 MG TABLET    Take 1 tablet (600 mg total) by mouth every 6 (six) hours as needed.   LIDOCAINE (XYLOCAINE) 2 % SOLUTION    Use as directed 10 mLs in the mouth or throat every 4 (four) hours as needed for mouth pain.         Jami  Jon GillsL Hagler, PA-C 01/08/16 1914    Jeanmarie PlantJames A McShane, MD 01/09/16 0001

## 2016-01-08 NOTE — ED Triage Notes (Signed)
PAtient presents to ER with R sided dental pain. Pt states this is recurrent dental pain that started again yesterday.

## 2017-05-15 ENCOUNTER — Encounter: Payer: Self-pay | Admitting: *Deleted

## 2017-05-15 ENCOUNTER — Other Ambulatory Visit: Payer: Self-pay

## 2017-05-15 DIAGNOSIS — E119 Type 2 diabetes mellitus without complications: Secondary | ICD-10-CM | POA: Insufficient documentation

## 2017-05-15 DIAGNOSIS — R1013 Epigastric pain: Secondary | ICD-10-CM | POA: Insufficient documentation

## 2017-05-15 DIAGNOSIS — Z7984 Long term (current) use of oral hypoglycemic drugs: Secondary | ICD-10-CM | POA: Insufficient documentation

## 2017-05-15 LAB — COMPREHENSIVE METABOLIC PANEL
ALT: 17 U/L (ref 14–54)
ANION GAP: 8 (ref 5–15)
AST: 16 U/L (ref 15–41)
Albumin: 4.1 g/dL (ref 3.5–5.0)
Alkaline Phosphatase: 77 U/L (ref 38–126)
BUN: 8 mg/dL (ref 6–20)
CHLORIDE: 101 mmol/L (ref 101–111)
CO2: 28 mmol/L (ref 22–32)
Calcium: 9.5 mg/dL (ref 8.9–10.3)
Creatinine, Ser: 0.89 mg/dL (ref 0.44–1.00)
GFR calc non Af Amer: >60 mL/min (ref >60)
Glucose, Bld: 362 mg/dL — ABNORMAL HIGH (ref 65–99)
Potassium: 4.1 mmol/L (ref 3.5–5.1)
SODIUM: 137 mmol/L (ref 135–145)
Total Bilirubin: 0.2 mg/dL — ABNORMAL LOW (ref 0.3–1.2)
Total Protein: 7.8 g/dL (ref 6.5–8.1)

## 2017-05-15 LAB — CBC
HCT: 38.3 % (ref 35.0–47.0)
HEMOGLOBIN: 12.6 g/dL (ref 12.0–16.0)
MCH: 27.8 pg (ref 26.0–34.0)
MCHC: 33 g/dL (ref 32.0–36.0)
MCV: 84.3 fL (ref 80.0–100.0)
Platelets: 327 10*3/uL (ref 150–440)
RBC: 4.54 MIL/uL (ref 3.80–5.20)
RDW: 15.1 % — ABNORMAL HIGH (ref 11.5–14.5)
WBC: 5.5 10*3/uL (ref 3.6–11.0)

## 2017-05-15 LAB — URINALYSIS, COMPLETE (UACMP) WITH MICROSCOPIC
Bacteria, UA: NONE SEEN
Bilirubin Urine: NEGATIVE
Glucose, UA: >=500 mg/dL — AB
HGB URINE DIPSTICK: NEGATIVE
Ketones, ur: NEGATIVE mg/dL
Leukocytes, UA: NEGATIVE
Nitrite: NEGATIVE
PROTEIN: NEGATIVE mg/dL
Specific Gravity, Urine: 1.029 (ref 1.005–1.030)
pH: 6 (ref 5.0–8.0)

## 2017-05-15 LAB — LIPASE, BLOOD: LIPASE: 60 U/L — AB (ref 11–51)

## 2017-05-15 LAB — POCT PREGNANCY, URINE: PREG TEST UR: NEGATIVE

## 2017-05-15 NOTE — ED Triage Notes (Signed)
Pt ambulatory to triage.  Pt reports abd pain with nausea.  Menses now.  No urinary sx.  No back pain.  Sx for 3 days.  Pt alert.

## 2017-05-15 NOTE — ED Notes (Signed)
poct pregnacy Negative 

## 2017-05-16 ENCOUNTER — Emergency Department: Payer: Self-pay

## 2017-05-16 ENCOUNTER — Emergency Department
Admission: EM | Admit: 2017-05-16 | Discharge: 2017-05-16 | Disposition: A | Discharge status: Home / Self Care | Payer: Self-pay | Attending: Emergency Medicine | Admitting: Emergency Medicine

## 2017-05-16 DIAGNOSIS — R1013 Epigastric pain: Secondary | ICD-10-CM

## 2017-05-16 MED ORDER — KETOROLAC TROMETHAMINE 60 MG/2ML IM SOLN
60.0000 mg | Freq: Once | INTRAMUSCULAR | Status: AC
  Administered 2017-05-16: 60 mg via INTRAMUSCULAR
  Filled 2017-05-16: qty 2

## 2017-05-16 MED ORDER — FAMOTIDINE 40 MG PO TABS: 40.0000 mg | ORAL_TABLET | Freq: Every evening | ORAL | 0 refills | Status: AC

## 2017-05-16 MED ORDER — SUCRALFATE 1 G PO TABS: 1.0000 g | ORAL_TABLET | Freq: Two times a day (BID) | ORAL | 0 refills | Status: DC

## 2017-05-16 NOTE — Discharge Instructions (Signed)
PLease follow up with your primary care physician for further evaluation of your symptoms.

## 2017-05-16 NOTE — ED Provider Notes (Signed)
[facility]   ____________________________________________   First MD Initiated Contact with Patient 05/16/17 0100     (approximate)  I have reviewed the triage vital signs and the nursing notes.   HISTORY  Chief Complaint Abdominal Pain    HPI Meredith Rasmussen is a 32 y.o. female who comes into the hospital today with some abdominal pain.  The patient states that her menstrual cycle has been coming later and later each month and she is been having some epigastric abdominal pain.  This pain started on Friday and is been getting worse through the weekend.  The patient did not take anything for pain.  She is been nauseous with no vomiting.  She states in the past she had similar pain with an ectopic pregnancy but that pain was much worse.  The patient also has an IUD.  Currently her pain is a 2 out of 10 in intensity.  She states that she wanted to get checked out to make sure that everything was okay.  She is been having normal bowel movements.  She is here today for evaluation.   Past Medical History:  Diagnosis Date  . Diabetes mellitus without complication (HCC)   . Ectopic pregnancy     Patient Active Problem List   Diagnosis Date Noted  . Irregular contractions 01/17/2015  . Labor and delivery, indication for care 12/21/2014  . Labor and delivery indication for care or intervention 12/01/2014  . Abdominal pain 10/16/2014    Past Surgical History:  Procedure Laterality Date  . ECTOPIC PREGNANCY SURGERY      Prior to Admission medications   Medication Sig Start Date End Date Taking? Authorizing Provider  amoxicillin (AMOXIL) 500 MG tablet Take 1 tablet (500 mg total) by mouth 3 (three) times daily with meals. 01/08/16   Hagler, Jami L, PA-C  calcium carbonate (TUMS - DOSED IN MG ELEMENTAL CALCIUM) 500 MG chewable tablet Chew 1 tablet by mouth daily.    [provider]  docusate sodium (COLACE) 100 MG  capsule Take 100 mg by mouth 2 (two) times daily.    [provider]  famotidine (PEPCID) 40 MG tablet Take 1 tablet (40 mg total) by mouth every evening. 05/16/17 05/16/18  Rebecka ApleyWebster, Whittaker Lenis P, MD  ferrous fumarate (HEMOCYTE - 106 MG FE) 325 (106 FE) MG TABS tablet Take 1 tablet by mouth.    [provider]  ibuprofen (ADVIL,MOTRIN) 600 MG tablet Take 1 tablet (600 mg total) by mouth every 6 (six) hours as needed. 01/08/16   Hagler, Jami L, PA-C  lidocaine (XYLOCAINE) 2 % solution Use as directed 10 mLs in the mouth or throat every 4 (four) hours as needed for mouth pain. 01/08/16   Hagler, Jami L, PA-C  metFORMIN (GLUCOPHAGE) 1000 MG tablet Take 1,000 mg by mouth 2 (two) times daily with a meal.    [provider]  polyethylene glycol (MIRALAX) packet Take 17 g by mouth daily. Patient not taking: Reported on 12/01/2014 09/02/14   Jene EveryKinner, Robert, MD  Prenatal Vit-Fe Fumarate-FA (MULTIVITAMIN-PRENATAL) 27-0.8 MG TABS tablet Take 1 tablet by mouth daily at 12 noon.    [provider]  sucralfate (CARAFATE) 1 g tablet Take 1 tablet (1 g total) by mouth 2 (two) times daily. 05/16/17   Rebecka ApleyWebster, Leidy Massar P, MD    Allergies Patient has no known allergies.  No family history on file.  Social History Social History   Tobacco Use  . Smoking  status: Never Smoker  . Smokeless tobacco: Never Used  Substance Use Topics  . Alcohol use: No  . Drug use: No    Review of Systems  Constitutional: No fever/chills Eyes: No visual changes. ENT: No sore throat. Cardiovascular: Denies chest pain. Respiratory: Denies shortness of breath. Gastrointestinal:  abdominal pain.   nausea, no vomiting.  No diarrhea.  No constipation. Genitourinary: Negative for dysuria. Musculoskeletal: Negative for back pain. Skin: Negative for rash. Neurological: Negative for headaches, focal weakness or numbness.   ____________________________________________   PHYSICAL EXAM:  VITAL  SIGNS: ED Triage Vitals  Enc Vitals Group     BP 05/15/17 2236 (!) 113/55     Pulse Rate 05/15/17 2236 75     Resp 05/15/17 2236 18     Temp 05/15/17 2236 98.7 F (37.1 C)     Temp Source 05/15/17 2236 Oral     SpO2 05/15/17 2236 99 %     Weight 05/15/17 2233 180 lb (81.6 kg)     Height 05/15/17 2233 4\' 11"  (1.499 m)     Head Circumference --      Peak Flow --      Pain Score 05/15/17 2233 4     Pain Loc --      Pain Edu? --      Excl. in GC? --     Constitutional: Alert and oriented. Well appearing and in mild distress. Eyes: Conjunctivae are normal. PERRL. EOMI. Head: Atraumatic. Nose: No congestion/rhinnorhea. Mouth/Throat: Mucous membranes are moist.  Oropharynx non-erythematous. Cardiovascular: Normal rate, regular rhythm. Grossly normal heart sounds.  Good peripheral circulation. Respiratory: Normal respiratory effort.  No retractions. Lungs CTAB. Gastrointestinal: Soft with some mild epigastric pain to palpation. No distention. Positive bowel sounds Musculoskeletal: No lower extremity tenderness nor edema.   Neurologic:  Normal speech and language.  Skin:  Skin is warm, dry and intact.  Psychiatric: Mood and affect are normal.   ____________________________________________   LABS (all labs ordered are listed, but only abnormal results are displayed)  Labs Reviewed  LIPASE, BLOOD - Abnormal; Notable for the following components:      Result Value   Lipase 60 (*)    All other components within normal limits  COMPREHENSIVE METABOLIC PANEL - Abnormal; Notable for the following components:   Glucose, Bld 362 (*)    Total Bilirubin 0.2 (*)    All other components within normal limits  CBC - Abnormal; Notable for the following components:   RDW 15.1 (*)    All other components within normal limits  URINALYSIS, COMPLETE (UACMP) WITH MICROSCOPIC - Abnormal; Notable for the following components:   Color, Urine STRAW (*)    APPearance CLEAR (*)    Glucose, UA >=500  (*)    Squamous Epithelial / LPF 0-5 (*)    All other components within normal limits  POC URINE PREG, ED  POCT PREGNANCY, URINE   ____________________________________________  EKG  none ____________________________________________  RADIOLOGY  ED MD interpretation:  [place] abd RUQ: Probable mild fatty liver otherwise unremarkable right upper quadrant ultrasound.  Official radiology report(s): US Abdomen Limited Ruq  Result Date: 05/16/2017 CLINICAL DATA:  32 year old female with epigastric pain. EXAM: ULTRASOUND ABDOMEN LIMITED RIGHT UPPER QUADRANT COMPARISON:  None. FINDINGS: Gallbladder: The gallbladder is predominantly contracted otherwise unremarkable. No gallstone, gallbladder wall thickening, or pericholecystic fluid. Negative sonographic Murphy's sign. Common bile duct: Diameter: 3 mm Liver: Mild increased liver echogenicity. Portal vein is patent on color Doppler imaging with normal direction of blood  flow towards the liver. IMPRESSION: Probable mild fatty liver otherwise unremarkable right upper quadrant ultrasound. Electronically Signed   By: Elgie Collard M.D.   On: 05/16/2017 03:42    ____________________________________________   PROCEDURES  Procedure(s) performed: None  Procedures  Critical Care performed: No  ____________________________________________   INITIAL IMPRESSION / ASSESSMENT AND PLAN / ED COURSE  As part of my medical decision making, I reviewed the following data within the electronic MEDICAL RECORD NUMBER Notes from prior ED visits and [place] Controlled Substance Database   This is a 32 year old female who comes into the hospital today with some epigastric abdominal pain.  The patient states that he has been getting worse but currently gets only a 2 out of 10 in intensity.  My differential diagnosis includes pancreatitis, gastritis, ulcer, biliary colic, cholecystitis, cholelithiasis.  The patient had some blood work checked.  Her CBC is unremarkable  and her CMP is significant for glucose of 362.  The patient's lipase is 60.  I will send the patient for an ultrasound taking a look at her gallbladder.  The patient will receive a shot of Toradol and she will be reassessed.     The patient's pain is improved and her ultrasound is negative.  She will be discharged home to follow-up with her primary care physician. ____________________________________________   FINAL CLINICAL IMPRESSION(S) / ED DIAGNOSES  Final diagnoses:  Epigastric pain     ED Discharge Orders        Ordered    famotidine (PEPCID) 40 MG tablet  Every evening     05/16/17 0353    sucralfate (CARAFATE) 1 g tablet  2 times daily     05/16/17 0353       Note:  This document was prepared using Dragon voice recognition software and may include unintentional dictation errors.    Rebecka Apley, MD 05/16/17 [phone]

## 2018-01-08 ENCOUNTER — Encounter: Payer: Self-pay | Admitting: Emergency Medicine

## 2018-01-08 ENCOUNTER — Emergency Department
Admission: EM | Admit: 2018-01-08 | Discharge: 2018-01-08 | Disposition: A | Discharge status: Home / Self Care | Payer: Medicaid Other | Attending: Emergency Medicine | Admitting: Emergency Medicine

## 2018-01-08 DIAGNOSIS — E119 Type 2 diabetes mellitus without complications: Secondary | ICD-10-CM | POA: Insufficient documentation

## 2018-01-08 DIAGNOSIS — K029 Dental caries, unspecified: Secondary | ICD-10-CM | POA: Insufficient documentation

## 2018-01-08 DIAGNOSIS — Z7984 Long term (current) use of oral hypoglycemic drugs: Secondary | ICD-10-CM | POA: Insufficient documentation

## 2018-01-08 DIAGNOSIS — K047 Periapical abscess without sinus: Secondary | ICD-10-CM

## 2018-01-08 MED ORDER — IBUPROFEN 800 MG PO TABS: 800.0000 mg | ORAL_TABLET | Freq: Three times a day (TID) | ORAL | 0 refills | Status: DC | PRN

## 2018-01-08 MED ORDER — AMOXICILLIN 500 MG PO CAPS: 500.0000 mg | ORAL_CAPSULE | Freq: Three times a day (TID) | ORAL | 0 refills | Status: DC

## 2018-01-08 MED ORDER — TRAMADOL HCL 50 MG PO TABS: 50.0000 mg | ORAL_TABLET | Freq: Four times a day (QID) | ORAL | 0 refills | Status: DC | PRN

## 2018-01-08 NOTE — ED Notes (Signed)
See triage note  Presents with pain to right lower jaw line  States she has had problems in past with same tooth  But this episode became worse 2 days ago

## 2018-01-08 NOTE — Discharge Instructions (Addendum)
Follow-up with 1 of the following dental clinics.  Take the medication as prescribed.  OPTIONS FOR DENTAL FOLLOW UP CARE  [place] Department of Health and Human Services - Local Safety Net Dental Clinics [email or web address]://www.ncdhhs.gov/dph/oralhealth/services/safetynetclinics.htm   [facility] [phone])  [facility] [phone])  [facility] [phone])  [facility] [phone])  [facility] [phone]) This clinic caters to the indigent population and is on a lottery system. Location: [employer] [address] [facility], [address], [place] [facility] Hours: Wednesdays from 6pm - 9pm, patients seen by a lottery system. For dates, call or go to [email or web address] Services: Cleanings, fillings and simple extractions. Payment Options: DENTAL WORK IS FREE OF CHARGE. Bring proof of income or support. Best way to get seen: Arrive at 5:15 pm - this is a lottery, NOT first come/first serve, so arriving earlier will not increase your chances of being seen.     [facility] [phone] Select option 1 for emergencies   Location: [facility], [facility], [address], [place] [facility] Hours: No walk-ins accepted - call the day before to schedule an appointment. Check in times are 9:30 am and 1:30 pm. Services: Simple extractions, temporary fillings, pulpectomy/pulp debridement, uncomplicated abscess drainage. Payment Options: PAYMENT IS DUE AT THE TIME OF SERVICE.  Fee is usually $100-200, additional surgical procedures (e.g. abscess drainage) may be extra. Cash, checks, Visa/MasterCard accepted.  Can file Medicaid if patient is covered for dental - patient should call case worker to check. No discount for [facility] patients. Best way to get seen: MUST call the day before and get onto the schedule. Can usually be seen the next 1-2  days. No walk-ins accepted.     [facility] [phone]   Location: [facility], [address], [place] [facility] Hours: M, W, Th, F 8am or 1:30pm, Tues 9a or 1:30 - first come/first served. Services: Simple extractions, temporary fillings, uncomplicated abscess drainage.  You do not need to [place] an [place] resident. Payment Options: PAYMENT IS DUE AT THE TIME OF SERVICE. Dental insurance, otherwise sliding scale - bring proof of income or support. Depending on income and treatment needed, cost is usually $50-200. Best way to get seen: Arrive early as it is first come/first served.     [facility] [phone]   Location: [address] Clinic Hours: Mon-Thu 8a-5p Services: Most basic dental services including extractions and fillings. Payment Options: PAYMENT IS DUE AT THE TIME OF SERVICE. Sliding scale, up to 50% off - bring proof if income or support. Medicaid with dental option accepted. Best way to get seen: Call to schedule an appointment, can usually be seen within 2 weeks OR they will try to see walk-ins - show up at 8a or 2p (you may have to wait).     [facility] [phone] [place] RESIDENTS ONLY   Location: [facility], [address], [place], [place] [address] Clinic Hours: By appointment only. Monday - Thursday 8am-5pm, Friday 8am-12pm Services: Cleanings, fillings, extractions. Payment Options: PAYMENT IS DUE AT THE TIME OF SERVICE. Cash, Visa or MasterCard. Sliding scale - $30 minimum per service. Best way to get seen: Come in to office, complete packet and make an appointment - need proof of income or support monies for each household member and proof of [place] residence. Usually takes about a month to get in.  [facility] [phone]   Location: [address],  [place] Clinic Hours: Walk-in Urgent Care Dental Services are offered Monday-Friday mornings only. The numbers of emergencies accepted daily is limited to the number of providers available. Maximum 15 - Mondays, Wednesdays & Thursdays Maximum 10 - Tuesdays & Fridays Services: You do not need to be a [facility] resident to be seen for a dental emergency. Emergencies are defined as pain, swelling, abnormal bleeding, or dental trauma. Walkins will receive x-rays if needed. NOTE: Dental cleaning is not an emergency. Payment Options: PAYMENT IS DUE AT THE TIME OF SERVICE. Minimum co-pay is $40.00 for uninsured patients. Minimum co-pay is $3.00 for Medicaid with dental coverage. Dental Insurance is accepted and must be presented at time of visit. Medicare does not cover dental. Forms of payment: Cash, credit card, checks. Best way to get seen: If not previously registered with the clinic, walk-in dental registration begins at 7:15 am and is on a first come/first serve basis. If previously registered with the clinic, call to make an appointment.     [facility] [place] ONLY   Location: [address], [place], [place] Clinic Hours: Mon-Thu 10a-2p Services: Extractions only! Payment Options: FREE (donations accepted) - bring proof of income or support Best way to get seen: Call and schedule an appointment OR come at 8am on the 1st Monday of every month (except for holidays) when it is first come/first served.     [facility] [phone]   Location: [place], [place] Clinic Hours: Friday mornings Services, Payment Options, Best way to get seen: Call for info

## 2018-01-08 NOTE — ED Triage Notes (Signed)
Pt reports toothache to right lower jaw for the past couple of days and now with swelling.

## 2018-01-08 NOTE — ED Provider Notes (Signed)
[facility] Emergency Department Provider Note  ____________________________________________   First MD Initiated Contact with Patient 01/08/18 1043     (approximate)  I have reviewed the triage vital signs and the nursing notes.   HISTORY  Chief Complaint Dental Pain and Oral Swelling    HPI Meredith Rasmussen is a 32 y.o. female presents to the emergency department complaining of dental pain.  She states she has pain on the right lower jaw.  She has had problems in the past with the exact same tooth.  She has several teeth that need to be pulled but has not had time to follow-up with a dentist.  She denies any fever or chills.    Past Medical History:  Diagnosis Date  . Diabetes mellitus without complication (HCC)   . Ectopic pregnancy     Patient Active Problem List   Diagnosis Date Noted  . Irregular contractions 01/17/2015  . Labor and delivery, indication for care 12/21/2014  . Labor and delivery indication for care or intervention 12/01/2014  . Abdominal pain 10/16/2014    Past Surgical History:  Procedure Laterality Date  . ECTOPIC PREGNANCY SURGERY      Prior to Admission medications   Medication Sig Start Date End Date Taking? Authorizing Provider  amoxicillin (AMOXIL) 500 MG capsule Take 1 capsule (500 mg total) by mouth 3 (three) times daily. 01/08/18   Ayomide Purdy, Roselyn Bering, PA-C  calcium carbonate (TUMS - DOSED IN MG ELEMENTAL CALCIUM) 500 MG chewable tablet Chew 1 tablet by mouth daily.    [provider]  docusate sodium (COLACE) 100 MG capsule Take 100 mg by mouth 2 (two) times daily.    [provider]  famotidine (PEPCID) 40 MG tablet Take 1 tablet (40 mg total) by mouth every evening. 05/16/17 05/16/18  Rebecka Apley, MD  ferrous fumarate (HEMOCYTE - 106 MG FE) 325 (106 FE) MG TABS tablet Take 1 tablet by mouth.    [provider]  ibuprofen (ADVIL,MOTRIN) 800 MG tablet Take 1 tablet (800 mg total) by  mouth every 8 (eight) hours as needed. 01/08/18   Nikeshia Keetch, Roselyn Bering, PA-C  metFORMIN (GLUCOPHAGE) 1000 MG tablet Take 1,000 mg by mouth 2 (two) times daily with a meal.    [provider]  sucralfate (CARAFATE) 1 g tablet Take 1 tablet (1 g total) by mouth 2 (two) times daily. 05/16/17   Rebecka Apley, MD  traMADol (ULTRAM) 50 MG tablet Take 1 tablet (50 mg total) by mouth every 6 (six) hours as needed. 01/08/18   Faythe Ghee, PA-C    Allergies Patient has no known allergies.  No family history on file.  Social History Social History   Tobacco Use  . Smoking status: Never Smoker  . Smokeless tobacco: Never Used  Substance Use Topics  . Alcohol use: No  . Drug use: No    Review of Systems  Constitutional: No fever/chills Eyes: No visual changes. ENT: No sore throat.  Positive dental pain Respiratory: Denies cough Genitourinary: Negative for dysuria. Musculoskeletal: Negative for back pain. Skin: Negative for rash.    ____________________________________________   PHYSICAL EXAM:  VITAL SIGNS: ED Triage Vitals  Enc Vitals Group     BP 01/08/18 1031 (!) 102/43     Pulse Rate 01/08/18 1031 95     Resp 01/08/18 1031 18     Temp 01/08/18 1031 98.6 F (37 C)     Temp Source 01/08/18 1031 Oral  SpO2 01/08/18 1031 97 %     Weight 01/08/18 1029 180 lb (81.6 kg)     Height 01/08/18 1029 4\' 11"  (1.499 m)     Head Circumference --      Peak Flow --      Pain Score 01/08/18 1029 7     Pain Loc --      Pain Edu? --      Excl. in GC? --     Constitutional: Alert and oriented. Well appearing and in no acute distress. Eyes: Conjunctivae are normal.  Head: Atraumatic. Nose: No congestion/rhinnorhea. Mouth/Throat: Mucous membranes are moist.  Positive for multiple dental caries.  One tooth is broken at the gumline.  Gumline is swollen and red on the right lower side.  No pus pocket is noted. Neck:  supple no lymphadenopathy noted Cardiovascular: Normal  rate, regular rhythm. Heart sounds are normal Respiratory: Normal respiratory effort.  No retractions, lungs c t a  GU: deferred Musculoskeletal: FROM all extremities, warm and well perfused Neurologic:  Normal speech and language.  Skin:  Skin is warm, dry and intact. No rash noted. Psychiatric: Mood and affect are normal. Speech and behavior are normal.  ____________________________________________   LABS (all labs ordered are listed, but only abnormal results are displayed)  Labs Reviewed - No data to display ____________________________________________   ____________________________________________  RADIOLOGY    ____________________________________________   PROCEDURES  Procedure(s) performed: No  Procedures    ____________________________________________   INITIAL IMPRESSION / ASSESSMENT AND PLAN / ED COURSE  Pertinent labs & imaging results that were available during my care of the patient were reviewed by me and considered in my medical decision making (see chart for details).   Patient is 32 year old female presents emergency department complaining of dental pain.  Physical exam shows a swollen jaw on the right side.  Several dental caries noted.  Poor dentition.  Remainder the exam is unremarkable.  Explained findings to the patient.  Explained to her how important is for her to follow-up with a dentist.  She was given a prescription for amoxicillin, ibuprofen, and tramadol.  She is to follow-up with 1 of the dental clinics that was provided on discharge papers.  She states she understands will comply.  She was discharged in stable condition     As part of my medical decision making, I reviewed the following data within the electronic MEDICAL RECORD NUMBER Nursing notes reviewed and incorporated, Old chart reviewed, Notes from prior ED visits and [place] Controlled Substance Database  ____________________________________________   FINAL CLINICAL IMPRESSION(S) / ED  DIAGNOSES  Final diagnoses:  Dental abscess  Dental caries      NEW MEDICATIONS STARTED DURING THIS VISIT:  New Prescriptions   AMOXICILLIN (AMOXIL) 500 MG CAPSULE    Take 1 capsule (500 mg total) by mouth 3 (three) times daily.   IBUPROFEN (ADVIL,MOTRIN) 800 MG TABLET    Take 1 tablet (800 mg total) by mouth every 8 (eight) hours as needed.   TRAMADOL (ULTRAM) 50 MG TABLET    Take 1 tablet (50 mg total) by mouth every 6 (six) hours as needed.     Note:  This document was prepared using Dragon voice recognition software and may include unintentional dictation errors.    Faythe GheeFisher, Jaythen Hamme W, PA-C 01/08/18 1055    Governor RooksLord, Rebecca, MD 01/08/18 1058

## 2018-03-30 ENCOUNTER — Encounter: Payer: Self-pay | Admitting: Emergency Medicine

## 2018-03-30 ENCOUNTER — Emergency Department
Admission: EM | Admit: 2018-03-30 | Discharge: 2018-03-30 | Disposition: A | Discharge status: Home / Self Care | Payer: Medicaid Other | Attending: Emergency Medicine | Admitting: Emergency Medicine

## 2018-03-30 ENCOUNTER — Other Ambulatory Visit: Payer: Self-pay

## 2018-03-30 DIAGNOSIS — R739 Hyperglycemia, unspecified: Secondary | ICD-10-CM

## 2018-03-30 DIAGNOSIS — E1165 Type 2 diabetes mellitus with hyperglycemia: Secondary | ICD-10-CM | POA: Insufficient documentation

## 2018-03-30 DIAGNOSIS — Z79899 Other long term (current) drug therapy: Secondary | ICD-10-CM | POA: Insufficient documentation

## 2018-03-30 DIAGNOSIS — Z7984 Long term (current) use of oral hypoglycemic drugs: Secondary | ICD-10-CM | POA: Insufficient documentation

## 2018-03-30 LAB — CBC WITH DIFFERENTIAL/PLATELET
ABS IMMATURE GRANULOCYTES: 0.01 10*3/uL (ref 0.00–0.07)
BASOS ABS: 0.1 10*3/uL (ref 0.0–0.1)
Basophils Relative: 1 %
EOS PCT: 3 %
Eosinophils Absolute: 0.1 10*3/uL (ref 0.0–0.5)
HEMATOCRIT: 37.6 % (ref 36.0–46.0)
HEMOGLOBIN: 12.7 g/dL (ref 12.0–15.0)
Immature Granulocytes: 0 %
LYMPHS PCT: 38 %
Lymphs Abs: 2.1 10*3/uL (ref 0.7–4.0)
MCH: 28.3 pg (ref 26.0–34.0)
MCHC: 33.8 g/dL (ref 30.0–36.0)
MCV: 83.9 fL (ref 80.0–100.0)
MONO ABS: 0.3 10*3/uL (ref 0.1–1.0)
Monocytes Relative: 6 %
NEUTROS ABS: 3 10*3/uL (ref 1.7–7.7)
Neutrophils Relative %: 52 %
Platelets: 350 10*3/uL (ref 150–400)
RBC: 4.48 MIL/uL (ref 3.87–5.11)
RDW: 12.5 % (ref 11.5–15.5)
WBC: 5.7 10*3/uL (ref 4.0–10.5)
nRBC: 0 % (ref 0.0–0.2)

## 2018-03-30 LAB — URINALYSIS, COMPLETE (UACMP) WITH MICROSCOPIC
BACTERIA UA: NONE SEEN
BILIRUBIN URINE: NEGATIVE
Hgb urine dipstick: NEGATIVE
KETONES UR: NEGATIVE mg/dL
Leukocytes,Ua: NEGATIVE
NITRITE: NEGATIVE
PH: 6 (ref 5.0–8.0)
Protein, ur: NEGATIVE mg/dL
Specific Gravity, Urine: 1.001 — ABNORMAL LOW (ref 1.005–1.030)

## 2018-03-30 LAB — POCT PREGNANCY, URINE: Preg Test, Ur: NEGATIVE

## 2018-03-30 LAB — COMPREHENSIVE METABOLIC PANEL
ALBUMIN: 3.9 g/dL (ref 3.5–5.0)
ALT: 17 U/L (ref 0–44)
AST: 16 U/L (ref 15–41)
Alkaline Phosphatase: 61 U/L (ref 38–126)
Anion gap: 8 (ref 5–15)
BUN: 12 mg/dL (ref 6–20)
CHLORIDE: 102 mmol/L (ref 98–111)
CO2: 24 mmol/L (ref 22–32)
CREATININE: 0.68 mg/dL (ref 0.44–1.00)
Calcium: 8.9 mg/dL (ref 8.9–10.3)
Glucose, Bld: 286 mg/dL — ABNORMAL HIGH (ref 70–99)
Potassium: 3.9 mmol/L (ref 3.5–5.1)
SODIUM: 134 mmol/L — AB (ref 135–145)
Total Bilirubin: 0.8 mg/dL (ref 0.3–1.2)
Total Protein: 7.6 g/dL (ref 6.5–8.1)

## 2018-03-30 LAB — GLUCOSE, CAPILLARY
Glucose-Capillary: 252 mg/dL — ABNORMAL HIGH (ref 70–99)
Glucose-Capillary: 223 mg/dL — ABNORMAL HIGH (ref 70–99)

## 2018-03-30 LAB — TROPONIN I: Troponin I: <0.03 ng/mL (ref <0.03)

## 2018-03-30 MED ORDER — SODIUM CHLORIDE 0.9 % IV BOLUS
1000.0000 mL | Freq: Once | INTRAVENOUS | Status: AC
  Administered 2018-03-30: 1000 mL via INTRAVENOUS

## 2018-03-30 MED ORDER — GLIPIZIDE 5 MG PO TABS: 10.0000 mg | ORAL_TABLET | Freq: Every day | ORAL | 0 refills | Status: DC

## 2018-03-30 NOTE — ED Provider Notes (Signed)
[facility] Emergency Department Provider Note  ____________________________________________  Time seen: Approximately 8:36 AM  I have reviewed the triage vital signs and the nursing notes.   HISTORY  Chief Complaint Hyperglycemia   HPI Meredith Rasmussen is a 33 y.o. female with a history of diabetes who presents for evaluation of hyperglycemia.  Patient reports that she has not checked her blood glucose for several months.  Yesterday she was feeling more tired than normal which she thought it was due to working to jobs.  However she did find her glucometer yesterday and decided to check her blood glucose which was elevated in the mid 200s.  Patient reports a several months ago when she was checking her blood glucose was usually below 200.  Patient reports polyuria and polydipsia.  She denies any flulike symptoms, cough, chest pain or shortness of breath, abdominal pain, nausea, vomiting, diarrhea, dysuria, fever or chills.  She endorses compliance with her oral glycemic agents.  Past Medical History:  Diagnosis Date  . Diabetes mellitus without complication (HCC)   . Ectopic pregnancy     Patient Active Problem List   Diagnosis Date Noted  . Irregular contractions 01/17/2015  . Labor and delivery, indication for care 12/21/2014  . Labor and delivery indication for care or intervention 12/01/2014  . Abdominal pain 10/16/2014    Past Surgical History:  Procedure Laterality Date  . ECTOPIC PREGNANCY SURGERY      Prior to Admission medications   Medication Sig Start Date End Date Taking? Authorizing Provider  amoxicillin (AMOXIL) 500 MG capsule Take 1 capsule (500 mg total) by mouth 3 (three) times daily. 01/08/18   Fisher, Roselyn BeringSusan W, PA-C  calcium carbonate (TUMS - DOSED IN MG ELEMENTAL CALCIUM) 500 MG chewable tablet Chew 1 tablet by mouth daily.    [provider]  docusate sodium (COLACE) 100 MG capsule Take 100 mg by mouth 2 (two) times daily.     [provider]  famotidine (PEPCID) 40 MG tablet Take 1 tablet (40 mg total) by mouth every evening. 05/16/17 05/16/18  Rebecka ApleyWebster, Allison P, MD  ferrous fumarate (HEMOCYTE - 106 MG FE) 325 (106 FE) MG TABS tablet Take 1 tablet by mouth.    [provider]  glipiZIDE (GLUCOTROL) 5 MG tablet Take 2 tablets (10 mg total) by mouth daily before breakfast. 03/30/18   Don PerkingVeronese, [place], MD  ibuprofen (ADVIL,MOTRIN) 800 MG tablet Take 1 tablet (800 mg total) by mouth every 8 (eight) hours as needed. 01/08/18   Fisher, Roselyn BeringSusan W, PA-C  metFORMIN (GLUCOPHAGE) 1000 MG tablet Take 1,000 mg by mouth 2 (two) times daily with a meal.    [provider]  sucralfate (CARAFATE) 1 g tablet Take 1 tablet (1 g total) by mouth 2 (two) times daily. 05/16/17   Rebecka ApleyWebster, Allison P, MD  traMADol (ULTRAM) 50 MG tablet Take 1 tablet (50 mg total) by mouth every 6 (six) hours as needed. 01/08/18   Faythe GheeFisher, Susan W, PA-C    Allergies Patient has no known allergies.  No family history on file.  Social History Social History   Tobacco Use  . Smoking status: Never Smoker  . Smokeless tobacco: Never Used  Substance Use Topics  . Alcohol use: No  . Drug use: No    Review of Systems  Constitutional: Negative for fever. + Fatigue Eyes: Negative for visual changes. ENT: Negative for sore throat. Neck: No neck pain  Cardiovascular: Negative for chest pain. Respiratory: Negative for shortness  of breath. Gastrointestinal: Negative for abdominal pain, vomiting or diarrhea. Genitourinary: Negative for dysuria. Endo: + Polyuria and polydipsia Musculoskeletal: Negative for back pain. Skin: Negative for rash. Neurological: Negative for headaches, weakness or numbness. Psych: No SI or HI  ____________________________________________   PHYSICAL EXAM:  VITAL SIGNS: ED Triage Vitals  Enc Vitals Group     BP 03/30/18 0757 120/64     Pulse Rate 03/30/18 0757 78     Resp 03/30/18 0757 18     Temp  03/30/18 0757 98.4 F (36.9 C)     Temp Source 03/30/18 0757 Oral     SpO2 03/30/18 0757 98 %     Weight 03/30/18 0758 180 lb (81.6 kg)     Height 03/30/18 0758 4\' 11"  (1.499 m)     Head Circumference --      Peak Flow --      Pain Score 03/30/18 0758 0     Pain Loc --      Pain Edu? --      Excl. in GC? --     Constitutional: Alert and oriented. Well appearing and in no apparent distress. HEENT:      Head: Normocephalic and atraumatic.         Eyes: Conjunctivae are normal. Sclera is non-icteric.       Mouth/Throat: Mucous membranes are moist.       Neck: Supple with no signs of meningismus. Cardiovascular: Regular rate and rhythm. No murmurs, gallops, or rubs. 2+ symmetrical distal pulses are present in all extremities. No JVD. Respiratory: Normal respiratory effort. Lungs are clear to auscultation bilaterally. No wheezes, crackles, or rhonchi.  Gastrointestinal: Soft, non tender, and non distended with positive bowel sounds. No rebound or guarding. Musculoskeletal: Nontender with normal range of motion in all extremities. No edema, cyanosis, or erythema of extremities. Neurologic: Normal speech and language. Face is symmetric. Moving all extremities. No gross focal neurologic deficits are appreciated. Skin: Skin is warm, dry and intact. No rash noted. Psychiatric: Mood and affect are normal. Speech and behavior are normal.  ____________________________________________   LABS (all labs ordered are listed, but only abnormal results are displayed)  Labs Reviewed  GLUCOSE, CAPILLARY - Abnormal; Notable for the following components:      Result Value   Glucose-Capillary 252 (*)    All other components within normal limits  COMPREHENSIVE METABOLIC PANEL - Abnormal; Notable for the following components:   Sodium 134 (*)    Glucose, Bld 286 (*)    All other components within normal limits  URINALYSIS, COMPLETE (UACMP) WITH MICROSCOPIC - Abnormal; Notable for the following  components:   Color, Urine COLORLESS (*)    APPearance CLEAR (*)    Specific Gravity, Urine 1.001 (*)    Glucose, UA >=500 (*)    All other components within normal limits  GLUCOSE, CAPILLARY - Abnormal; Notable for the following components:   Glucose-Capillary 223 (*)    All other components within normal limits  CBC WITH DIFFERENTIAL/PLATELET  TROPONIN I  POCT PREGNANCY, URINE   ____________________________________________  EKG  ED ECG REPORT I, Nita Sicklearolina Keya Wynes, the attending physician, personally viewed and interpreted this ECG.  Normal sinus rhythm, rate of 82, normal intervals, normal axis, no ST elevations or depression, T wave inversion in lead III.  No significant changes when compared to prior ____________________________________________  RADIOLOGY  none  ____________________________________________   PROCEDURES  Procedure(s) performed: None Procedures Critical Care performed:  None ____________________________________________   INITIAL IMPRESSION / ASSESSMENT AND  PLAN / ED COURSE   33 y.o. female with a history of diabetes who presents for evaluation of hyperglycemia.  Patient with no infectious symptoms.  Endorses compliance with her medications.  Has not been checking her blood glucose for several months.  UA negative for UTI.  Pregnancy test is negative. No infectious symptoms. Labs pending to rule DKA. Will give IVF and reassess  Clinical Course as of Mar 30 1032  Thu Mar 30, 2018  1029 Labs with no signs of DKA.  Sugars trending down.  Will increase glipizide from 2.5 mg daily to 5 mg daily.  Patient is also metformin at thousand twice daily.  Recommend close follow-up with primary care doctor for further evaluation.  Discussed return precautions.   [CV]    Clinical Course User Index [CV] Don Perking [place], MD     As part of my medical decision making, I reviewed the following data within the electronic MEDICAL RECORD NUMBER Nursing notes reviewed and  incorporated, Labs reviewed , EKG interpreted , Old EKG reviewed, Old chart reviewed, Notes from prior ED visits and [place] Controlled Substance Database    Pertinent labs & imaging results that were available during my care of the patient were reviewed by me and considered in my medical decision making (see chart for details).    ____________________________________________   FINAL CLINICAL IMPRESSION(S) / ED DIAGNOSES  Final diagnoses:  Hyperglycemia      NEW MEDICATIONS STARTED DURING THIS VISIT:  ED Discharge Orders         Ordered    glipiZIDE (GLUCOTROL) 5 MG tablet  Daily before breakfast     03/30/18 1030           Note:  This document was prepared using Dragon voice recognition software and may include unintentional dictation errors.    Don Perking, [place], MD 03/30/18 1034

## 2018-03-30 NOTE — Discharge Instructions (Addendum)
Increase your glipizide to 2 pills a day before breakfast.  Continue metformin 1000 mg twice a day.  Follow-up with your doctor in 1 to 2 days for reevaluation.  Check her blood glucose several times a day and return to the emergency room if it is above 400

## 2018-03-30 NOTE — ED Triage Notes (Signed)
Pt c/o feeling tired and sluggish the past few days, takes 2 pills for her blood sugar but states it was high last night 263. This am, 258 and 298 at home. Appears in NAD.

## 2018-04-09 ENCOUNTER — Other Ambulatory Visit: Payer: Self-pay

## 2018-04-09 ENCOUNTER — Emergency Department
Admission: EM | Admit: 2018-04-09 | Discharge: 2018-04-09 | Disposition: A | Discharge status: Home / Self Care | Payer: Medicaid Other | Attending: Emergency Medicine | Admitting: Emergency Medicine

## 2018-04-09 DIAGNOSIS — Z7984 Long term (current) use of oral hypoglycemic drugs: Secondary | ICD-10-CM | POA: Insufficient documentation

## 2018-04-09 DIAGNOSIS — K047 Periapical abscess without sinus: Secondary | ICD-10-CM | POA: Insufficient documentation

## 2018-04-09 DIAGNOSIS — E119 Type 2 diabetes mellitus without complications: Secondary | ICD-10-CM | POA: Insufficient documentation

## 2018-04-09 DIAGNOSIS — Z79899 Other long term (current) drug therapy: Secondary | ICD-10-CM | POA: Insufficient documentation

## 2018-04-09 MED ORDER — ACETAMINOPHEN 500 MG PO TABS
1000.0000 mg | ORAL_TABLET | Freq: Once | ORAL | Status: AC
  Administered 2018-04-09: 1000 mg via ORAL
  Filled 2018-04-09: qty 2

## 2018-04-09 MED ORDER — AMOXICILLIN 500 MG PO CAPS
1000.0000 mg | ORAL_CAPSULE | Freq: Once | ORAL | Status: AC
  Administered 2018-04-09: 1000 mg via ORAL
  Filled 2018-04-09: qty 2

## 2018-04-09 MED ORDER — AMOXICILLIN 875 MG PO TABS: 875.0000 mg | ORAL_TABLET | Freq: Two times a day (BID) | ORAL | 0 refills | Status: DC

## 2018-04-09 MED ORDER — TRAMADOL HCL 50 MG PO TABS: 50.0000 mg | ORAL_TABLET | Freq: Four times a day (QID) | ORAL | 0 refills | Status: DC | PRN

## 2018-04-09 MED ORDER — IBUPROFEN 600 MG PO TABS: 600.0000 mg | ORAL_TABLET | Freq: Four times a day (QID) | ORAL | 0 refills | Status: DC | PRN

## 2018-04-09 NOTE — ED Triage Notes (Signed)
Dental pain since Thursday. Thinks tooth abscess. L lower side. A&O, speaking in complete sentences. No distress noted. Ambulatory.

## 2018-04-09 NOTE — ED Provider Notes (Signed)
[facility] EMERGENCY DEPARTMENT Provider Note   CSN: [id number] Arrival date & time: 04/09/18  1223    History   Chief Complaint Chief Complaint  Patient presents with  . Dental Pain    HPI Meredith Rasmussen is a 33 y.o. female.  Presents to the emergency department for evaluation of dental pain x5 days.  She describes left lower jaw swelling with pain along the left lower first molar.  She denies any difficulty swallowing.  She has not any medications for pain.  Pain is 4 out of 10.  She has had a fractured tooth on the left lower first molar for a few years.     HPI  Past Medical History:  Diagnosis Date  . Diabetes mellitus without complication (HCC)   . Ectopic pregnancy     Patient Active Problem List   Diagnosis Date Noted  . Irregular contractions 01/17/2015  . Labor and delivery, indication for care 12/21/2014  . Labor and delivery indication for care or intervention 12/01/2014  . Abdominal pain 10/16/2014    Past Surgical History:  Procedure Laterality Date  . ECTOPIC PREGNANCY SURGERY       OB History    Gravida  4   Para  1   Term  1   Preterm  0   AB  2   Living  1     SAB      TAB      Ectopic  1   Multiple      Live Births               Home Medications    Prior to Admission medications   Medication Sig Start Date End Date Taking? Authorizing Provider  amoxicillin (AMOXIL) 875 MG tablet Take 1 tablet (875 mg total) by mouth 2 (two) times daily. X 10 days 04/09/18   Evon Slack, PA-C  calcium carbonate (TUMS - DOSED IN MG ELEMENTAL CALCIUM) 500 MG chewable tablet Chew 1 tablet by mouth daily.    [provider]  docusate sodium (COLACE) 100 MG capsule Take 100 mg by mouth 2 (two) times daily.    [provider]  famotidine (PEPCID) 40 MG tablet Take 1 tablet (40 mg total) by mouth every evening. 05/16/17 05/16/18  Rebecka Apley, MD  ferrous fumarate (HEMOCYTE - 106 MG FE) 325 (106  FE) MG TABS tablet Take 1 tablet by mouth.    [provider]  glipiZIDE (GLUCOTROL) 5 MG tablet Take 2 tablets (10 mg total) by mouth daily before breakfast. 03/30/18   Don Perking, [place], MD  ibuprofen (ADVIL,MOTRIN) 600 MG tablet Take 1 tablet (600 mg total) by mouth every 6 (six) hours as needed for moderate pain. 04/09/18   Evon Slack, PA-C  metFORMIN (GLUCOPHAGE) 1000 MG tablet Take 1,000 mg by mouth 2 (two) times daily with a meal.    [provider]  sucralfate (CARAFATE) 1 g tablet Take 1 tablet (1 g total) by mouth 2 (two) times daily. 05/16/17   Rebecka Apley, MD  traMADol (ULTRAM) 50 MG tablet Take 1 tablet (50 mg total) by mouth every 6 (six) hours as needed. 04/09/18   Evon Slack, PA-C    Family History History reviewed. No pertinent family history.  Social History Social History   Tobacco Use  . Smoking status: Never Smoker  . Smokeless tobacco: Never Used  Substance Use Topics  . Alcohol use: No  . Drug use: No  Allergies   Patient has no known allergies.   Review of Systems Review of Systems  Constitutional: Negative.  Negative for chills and fever.  HENT: Positive for dental problem and facial swelling. Negative for drooling, mouth sores, trouble swallowing and voice change.   Respiratory: Negative for shortness of breath.   Cardiovascular: Negative for chest pain.  Gastrointestinal: Negative for nausea and vomiting.  Musculoskeletal: Negative for arthralgias, neck pain and neck stiffness.  Skin: Negative.   Psychiatric/Behavioral: Negative for confusion.  All other systems reviewed and are negative.    Physical Exam Updated Vital Signs BP (!) 118/58 (BP Location: Left Arm)   Pulse 63   Temp 98.1 F (36.7 C) (Oral)   Resp 16   Ht  (1.499 m)   Wt 85.3 kg   LMP 03/30/2018   SpO2 100%   BMI 37.97 kg/m   Physical Exam Constitutional:      General: She is not in acute distress.    Appearance: She is  well-developed.  HENT:     Head: Normocephalic and atraumatic.     Jaw: No trismus.     Right Ear: External ear normal.     Left Ear: External ear normal.     Nose: Nose normal.     Mouth/Throat:     Mouth: No oral lesions.     Dentition: Normal dentition.     Pharynx: Uvula midline. No uvula swelling.   Neck:     Musculoskeletal: Normal range of motion and neck supple.  Cardiovascular:     Rate and Rhythm: Normal rate.     Heart sounds: No murmur. No friction rub. No gallop.   Pulmonary:     Effort: Pulmonary effort is normal. No respiratory distress.     Breath sounds: Normal breath sounds.  Skin:    General: Skin is warm and dry.  Neurological:     Mental Status: She is alert and oriented to person, place, and time.  Psychiatric:        Behavior: Behavior normal.        Thought Content: Thought content normal.      ED Treatments / Results  Labs (all labs ordered are listed, but only abnormal results are displayed) Labs Reviewed - No data to display  EKG None  Radiology No results found.  Procedures Procedures (including critical care time)  Medications Ordered in ED Medications  amoxicillin (AMOXIL) capsule 1,000 mg (has no administration in time range)  acetaminophen (TYLENOL) tablet 1,000 mg (has no administration in time range)     Initial Impression / Assessment and Plan / ED Course  I have reviewed the triage vital signs and the nursing notes.  Pertinent labs & imaging results that were available during my care of the patient were reviewed by me and considered in my medical decision making (see chart for details).        33 year old female with chronic dental fracture with underlying acute infection today.  Vital signs are stable, afebrile.  No sign of abscess formation.  She is started on amoxicillin, tramadol Tylenol and ibuprofen.  She understands signs symptoms return to ED for.  She will follow with dental clinic.  Final Clinical  Impressions(s) / ED Diagnoses   Final diagnoses:  Dental infection    ED Discharge Orders         Ordered    traMADol (ULTRAM) 50 MG tablet  Every 6 hours PRN     04/09/18 1430    ibuprofen (  ADVIL,MOTRIN) 600 MG tablet  Every 6 hours PRN     04/09/18 1430    amoxicillin (AMOXIL) 875 MG tablet  2 times daily     04/09/18 1430           Ronnette Juniper 04/09/18 1433    Jene Every, MD 04/09/18 1434

## 2018-04-09 NOTE — Discharge Instructions (Addendum)
Please take antibiotics as prescribed.  Follow-up with PCP or dental clinic.  Return to the emergency department for any fevers worsening symptoms or urgent changes in health.

## 2018-08-16 ENCOUNTER — Telehealth: Payer: Self-pay | Admitting: Family Medicine

## 2018-08-16 NOTE — Telephone Encounter (Signed)
Returned call-states was calling [facility] by mistake. Was returning a call to social services.

## 2019-04-16 ENCOUNTER — Emergency Department
Admission: EM | Admit: 2019-04-16 | Discharge: 2019-04-16 | Disposition: A | Discharge status: Home / Self Care | Payer: Medicaid Other | Attending: Emergency Medicine | Admitting: Emergency Medicine

## 2019-04-16 ENCOUNTER — Other Ambulatory Visit: Payer: Self-pay

## 2019-04-16 ENCOUNTER — Encounter: Payer: Self-pay | Admitting: *Deleted

## 2019-04-16 ENCOUNTER — Emergency Department: Payer: Medicaid Other

## 2019-04-16 DIAGNOSIS — E119 Type 2 diabetes mellitus without complications: Secondary | ICD-10-CM | POA: Insufficient documentation

## 2019-04-16 DIAGNOSIS — Z7984 Long term (current) use of oral hypoglycemic drugs: Secondary | ICD-10-CM | POA: Insufficient documentation

## 2019-04-16 DIAGNOSIS — R102 Pelvic and perineal pain: Secondary | ICD-10-CM

## 2019-04-16 DIAGNOSIS — R112 Nausea with vomiting, unspecified: Secondary | ICD-10-CM | POA: Insufficient documentation

## 2019-04-16 DIAGNOSIS — Z79899 Other long term (current) drug therapy: Secondary | ICD-10-CM | POA: Insufficient documentation

## 2019-04-16 LAB — URINALYSIS, COMPLETE (UACMP) WITH MICROSCOPIC
Bilirubin Urine: NEGATIVE
Glucose, UA: NEGATIVE mg/dL
Hgb urine dipstick: NEGATIVE
Ketones, ur: NEGATIVE mg/dL
Leukocytes,Ua: NEGATIVE
Nitrite: NEGATIVE
Protein, ur: NEGATIVE mg/dL
Specific Gravity, Urine: 1.003 — ABNORMAL LOW (ref 1.005–1.030)
pH: 6 (ref 5.0–8.0)

## 2019-04-16 LAB — CBC
HCT: 37.6 % (ref 36.0–46.0)
Hemoglobin: 12.8 g/dL (ref 12.0–15.0)
MCH: 28.3 pg (ref 26.0–34.0)
MCHC: 34 g/dL (ref 30.0–36.0)
MCV: 83.2 fL (ref 80.0–100.0)
Platelets: 335 10*3/uL (ref 150–400)
RBC: 4.52 MIL/uL (ref 3.87–5.11)
RDW: 12.3 % (ref 11.5–15.5)
WBC: 5.4 10*3/uL (ref 4.0–10.5)
nRBC: 0 % (ref 0.0–0.2)

## 2019-04-16 LAB — COMPREHENSIVE METABOLIC PANEL
ALT: 15 U/L (ref 0–44)
AST: 16 U/L (ref 15–41)
Albumin: 4.4 g/dL (ref 3.5–5.0)
Alkaline Phosphatase: 57 U/L (ref 38–126)
Anion gap: 5 (ref 5–15)
BUN: 11 mg/dL (ref 6–20)
CO2: 30 mmol/L (ref 22–32)
Calcium: 9.5 mg/dL (ref 8.9–10.3)
Chloride: 102 mmol/L (ref 98–111)
Creatinine, Ser: 0.7 mg/dL (ref 0.44–1.00)
GFR calc Af Amer: >60 mL/min (ref >60)
GFR calc non Af Amer: >60 mL/min (ref >60)
Glucose, Bld: 134 mg/dL — ABNORMAL HIGH (ref 70–99)
Potassium: 3.6 mmol/L (ref 3.5–5.1)
Sodium: 137 mmol/L (ref 135–145)
Total Bilirubin: 0.5 mg/dL (ref 0.3–1.2)
Total Protein: 7.6 g/dL (ref 6.5–8.1)

## 2019-04-16 LAB — POC URINE PREG, ED: Preg Test, Ur: NEGATIVE

## 2019-04-16 LAB — HCG, QUANTITATIVE, PREGNANCY: hCG, Beta Chain, Quant, S: <1 m[IU]/mL (ref <5)

## 2019-04-16 LAB — LIPASE, BLOOD: Lipase: 46 U/L (ref 11–51)

## 2019-04-16 MED ORDER — SODIUM CHLORIDE 0.9% FLUSH: 3.0000 mL | Freq: Once | INTRAVENOUS | Status: DC

## 2019-04-16 NOTE — Discharge Instructions (Addendum)
Your ultrasound shows an ovarian cyst (or a possible endometrioma), as well as a small fibroid.  All of these could be causing your pain.  You should follow-up with an OB/GYN in the next several weeks.  We have given you information for 2 of the area OB/GYN practices.  You will need a repeat ultrasound in a few months.  Return to the ER immediately for new, worsening, or persistent severe pain, nausea or vomiting, fever, vaginal bleeding or discharge, or any other new or worsening symptoms that concern you.

## 2019-04-16 NOTE — ED Notes (Signed)
Pt transported to US

## 2019-04-16 NOTE — ED Provider Notes (Signed)
[facility] Emergency Department Provider Note ____________________________________________   First MD Initiated Contact with Patient 04/16/19 2024     (approximate)  I have reviewed the triage vital signs and the nursing notes.   HISTORY  Chief Complaint Pelvic Pain    HPI Meredith Rasmussen is a 34 y.o. female with PMH as noted below who presents with pelvic pain, primarily on the right side, gradual onset over the last few days, associated with nausea and one episode of vomiting yesterday.  She has had no associated vaginal bleeding, discharge, urinary symptoms, or diarrhea.  She states that she her last normal period was on January 18, and she subsequently had a positive pregnancy test in early February but then had several days of spotting around February 20.  Past Medical History:  Diagnosis Date  . Diabetes mellitus without complication (HCC)   . Ectopic pregnancy     Patient Active Problem List   Diagnosis Date Noted  . Irregular contractions 01/17/2015  . Labor and delivery, indication for care 12/21/2014  . Labor and delivery indication for care or intervention 12/01/2014  . Abdominal pain 10/16/2014    Past Surgical History:  Procedure Laterality Date  . ECTOPIC PREGNANCY SURGERY      Prior to Admission medications   Medication Sig Start Date End Date Taking? Authorizing Provider  amoxicillin (AMOXIL) 875 MG tablet Take 1 tablet (875 mg total) by mouth 2 (two) times daily. X 10 days 04/09/18   Evon Slack, PA-C  calcium carbonate (TUMS - DOSED IN MG ELEMENTAL CALCIUM) 500 MG chewable tablet Chew 1 tablet by mouth daily.    [provider]  docusate sodium (COLACE) 100 MG capsule Take 100 mg by mouth 2 (two) times daily.    [provider]  famotidine (PEPCID) 40 MG tablet Take 1 tablet (40 mg total) by mouth every evening. 05/16/17 05/16/18  Rebecka Apley, MD  ferrous fumarate (HEMOCYTE - 106 MG FE) 325 (106 FE) MG  TABS tablet Take 1 tablet by mouth.    [provider]  glipiZIDE (GLUCOTROL) 5 MG tablet Take 2 tablets (10 mg total) by mouth daily before breakfast. 03/30/18   Don Perking, [place], MD  ibuprofen (ADVIL,MOTRIN) 600 MG tablet Take 1 tablet (600 mg total) by mouth every 6 (six) hours as needed for moderate pain. 04/09/18   Evon Slack, PA-C  metFORMIN (GLUCOPHAGE) 1000 MG tablet Take 1,000 mg by mouth 2 (two) times daily with a meal.    [provider]  sucralfate (CARAFATE) 1 g tablet Take 1 tablet (1 g total) by mouth 2 (two) times daily. 05/16/17   Rebecka Apley, MD  traMADol (ULTRAM) 50 MG tablet Take 1 tablet (50 mg total) by mouth every 6 (six) hours as needed. 04/09/18   Evon Slack, PA-C    Allergies Patient has no known allergies.  History reviewed. No pertinent family history.  Social History Social History   Tobacco Use  . Smoking status: Never Smoker  . Smokeless tobacco: Never Used  Substance Use Topics  . Alcohol use: No  . Drug use: No    Review of Systems  Constitutional: No fever/chills. Eyes: No visual changes. ENT: No sore throat. Cardiovascular: Denies chest pain. Respiratory: Denies shortness of breath. Gastrointestinal: Positive for nausea.  No diarrhea. Genitourinary: Negative for dysuria, vaginal bleeding, or discharge.  Musculoskeletal: Negative for back pain. Skin: Negative for rash. Neurological: Negative for headache.   ____________________________________________   PHYSICAL EXAM:  VITAL SIGNS: ED Triage Vitals  Enc Vitals Group     BP 04/16/19 1954 119/82     Pulse Rate 04/16/19 1954 82     Resp 04/16/19 1954 16     Temp --      Temp src --      SpO2 04/16/19 1954 99 %     Weight 04/16/19 1955 188 lb (85.3 kg)     Height 04/16/19 1955 4\' 11"  (1.499 m)     Head Circumference --      [place] Flow --      Pain Score 04/16/19 1954 6     Pain Loc --      Pain Edu? --      Excl. in [place]? --     Constitutional:  Alert and oriented. Well appearing and in no acute distress. Eyes: Conjunctivae are normal.  Head: Atraumatic. Nose: No congestion/rhinnorhea. Mouth/Throat: Mucous membranes are moist.   Neck: Normal range of motion.  Cardiovascular:  Good peripheral circulation. Respiratory: Normal respiratory effort.  No retractions.  Gastrointestinal: Soft and nontender. No distention.  Genitourinary: No flank tenderness. Musculoskeletal: Extremities warm and well perfused.  Neurologic:  Normal speech and language. No gross focal neurologic deficits are appreciated.  Skin:  Skin is warm and dry. No rash noted. Psychiatric: Mood and affect are normal. Speech and behavior are normal.  ____________________________________________   LABS (all labs ordered are listed, but only abnormal results are displayed)  Labs Reviewed  COMPREHENSIVE METABOLIC PANEL - Abnormal; Notable for the following components:      Result Value   Glucose, Bld 134 (*)    All other components within normal limits  URINALYSIS, COMPLETE (UACMP) WITH MICROSCOPIC - Abnormal; Notable for the following components:   Color, Urine YELLOW (*)    APPearance CLEAR (*)    Specific Gravity, Urine 1.003 (*)    Bacteria, UA RARE (*)    All other components within normal limits  LIPASE, BLOOD  CBC  HCG, QUANTITATIVE, PREGNANCY  POC URINE PREG, ED   ____________________________________________  EKG   ____________________________________________  RADIOLOGY  US pelvis transvaginal: 1.5 cm left ovarian lesion, possible hemorrhagic or follicular cyst or endometrioma.  1.8 cm right uterine fibroid.  ____________________________________________   PROCEDURES  Procedure(s) performed: No  Procedures  Critical Care performed: No ____________________________________________   INITIAL IMPRESSION / ASSESSMENT AND PLAN / ED COURSE  Pertinent labs & imaging results that were available during my care of the patient were reviewed by  me and considered in my medical decision making (see chart for details).  34 year old female with PMH as noted above including a prior history of an ectopic pregnancy presents with pelvic pain that is bilateral but somewhat worse on the right over the last few days, associated with nausea and one episode of vomiting yesterday.  She has no vaginal bleeding, discharge, or urinary symptoms.  She had a positive pregnancy test at home a few weeks ago.  On exam, the patient is well-appearing.  Her vital signs are normal.  The abdomen is soft and nontender.  Urine pregnancy here is negative, so I have a low suspicion for pregnancy related etiology.  Differential includes ovarian cyst rupture, mittelschmerz, fibroid, endometriosis.  Given the patient's overall well appearance and benign exam, there is no clinical evidence for ovarian torsion.  Given the location of the pain and the lack of tenderness on exam, there is also no evidence for appendicitis or other GI etiology.  We will obtain labs including  serum hCG, urinalysis, pelvic ultrasound, and reassess.  ----------------------------------------- 10:11 PM on 04/16/2019 -----------------------------------------  Ultrasound shows a small right sided fibroid and a small left ovarian lesion which is a possible hemorrhagic cyst or endometrioma.   On reassessment, the patient continues to appear comfortable.  She has not required any pain medication in the ED.  Serum hCG is negative, and the lab work-up is otherwise unremarkable.  Overall I suspect her pain is likely related to the ultrasound findings.  At this time, the patient is stable for discharge home.  She will need OB/GYN follow-up for repeat ultrasound.  I gave her referral information.  Return precautions given, and she expresses understanding.  ____________________________________________   FINAL CLINICAL IMPRESSION(S) / ED DIAGNOSES  Final diagnoses:  Pelvic pain      NEW MEDICATIONS  STARTED DURING THIS VISIT:  New Prescriptions   No medications on file     Note:  This document was prepared using Dragon voice recognition software and may include unintentional dictation errors.    Dionne Bucy, MD 04/16/19 2212

## 2019-04-16 NOTE — ED Triage Notes (Addendum)
Pt ED reporting right sided pelvic pain that worsens with movement. Pt had a positive pregnancy test two weeks ago but then spotting on Feb. 20th but not a regular period per pt. Last regular period was in January. No vaginal bleeding or discharge at this time. Nausea with one episode of vomiting yesterday. No diarrhea or fevers.

## 2019-08-17 ENCOUNTER — Telehealth: Payer: Self-pay

## 2019-08-17 NOTE — Telephone Encounter (Signed)
Telephone call to patient today regarding the need for a Physical and Repeat PAP (due 07/2019).  Patient desires to call back to schedule appointment.  Appointment line number provided. Hart Carwin, RN

## 2019-09-13 NOTE — Telephone Encounter (Signed)
Telephone call to patient today regarding the need for a repeat PAP.  Left a message to return my call.  Marigene Ehlers available to assist with scheduling an appointment in Epic.  PAP letter mailed today.  Hart Carwin, RN

## 2019-09-19 ENCOUNTER — Telehealth: Payer: Self-pay

## 2019-09-19 NOTE — Telephone Encounter (Signed)
Telephone call to patient today regarding the need for a repeat PAP and call dropped. Hart Carwin, RN

## 2019-09-25 ENCOUNTER — Telehealth: Payer: Self-pay

## 2019-09-25 NOTE — Telephone Encounter (Signed)
Telephone call to patient today.  She will call back later to reschedule her own appointment once she checks her dental appointment time for Monday 10/01/2019.  She will try to coordinate appointments.  Appointment line number given to patient today upon request. Hart Carwin, RN

## 2020-07-10 ENCOUNTER — Encounter: Payer: Self-pay | Admitting: Emergency Medicine

## 2020-07-10 ENCOUNTER — Emergency Department
Admission: EM | Admit: 2020-07-10 | Discharge: 2020-07-10 | Disposition: A | Discharge status: Home / Self Care | Payer: Self-pay | Attending: Emergency Medicine | Admitting: Emergency Medicine

## 2020-07-10 ENCOUNTER — Emergency Department: Payer: Self-pay

## 2020-07-10 ENCOUNTER — Other Ambulatory Visit: Payer: Self-pay

## 2020-07-10 DIAGNOSIS — R1032 Left lower quadrant pain: Secondary | ICD-10-CM

## 2020-07-10 DIAGNOSIS — N926 Irregular menstruation, unspecified: Secondary | ICD-10-CM | POA: Insufficient documentation

## 2020-07-10 DIAGNOSIS — R300 Dysuria: Secondary | ICD-10-CM | POA: Insufficient documentation

## 2020-07-10 DIAGNOSIS — R102 Pelvic and perineal pain unspecified side: Secondary | ICD-10-CM

## 2020-07-10 DIAGNOSIS — R52 Pain, unspecified: Secondary | ICD-10-CM

## 2020-07-10 DIAGNOSIS — E119 Type 2 diabetes mellitus without complications: Secondary | ICD-10-CM | POA: Insufficient documentation

## 2020-07-10 DIAGNOSIS — Z7984 Long term (current) use of oral hypoglycemic drugs: Secondary | ICD-10-CM | POA: Insufficient documentation

## 2020-07-10 LAB — WET PREP, GENITAL
Clue Cells Wet Prep HPF POC: NONE SEEN
Sperm: NONE SEEN
Trich, Wet Prep: NONE SEEN
Yeast Wet Prep HPF POC: NONE SEEN

## 2020-07-10 LAB — URINALYSIS, COMPLETE (UACMP) WITH MICROSCOPIC
Bacteria, UA: NONE SEEN
Bilirubin Urine: NEGATIVE
Glucose, UA: 50 mg/dL — AB
Hgb urine dipstick: NEGATIVE
Ketones, ur: NEGATIVE mg/dL
Leukocytes,Ua: NEGATIVE
Nitrite: NEGATIVE
Protein, ur: NEGATIVE mg/dL
Specific Gravity, Urine: 1.021 (ref 1.005–1.030)
pH: 5 (ref 5.0–8.0)

## 2020-07-10 LAB — CBC
HCT: 35.6 % — ABNORMAL LOW (ref 36.0–46.0)
Hemoglobin: 12.1 g/dL (ref 12.0–15.0)
MCH: 29.8 pg (ref 26.0–34.0)
MCHC: 34 g/dL (ref 30.0–36.0)
MCV: 87.7 fL (ref 80.0–100.0)
Platelets: 299 10*3/uL (ref 150–400)
RBC: 4.06 MIL/uL (ref 3.87–5.11)
RDW: 12.2 % (ref 11.5–15.5)
WBC: 6.4 10*3/uL (ref 4.0–10.5)
nRBC: 0 % (ref 0.0–0.2)

## 2020-07-10 LAB — COMPREHENSIVE METABOLIC PANEL
ALT: 13 U/L (ref 0–44)
AST: 15 U/L (ref 15–41)
Albumin: 3.9 g/dL (ref 3.5–5.0)
Alkaline Phosphatase: 55 U/L (ref 38–126)
Anion gap: 10 (ref 5–15)
BUN: 10 mg/dL (ref 6–20)
CO2: 26 mmol/L (ref 22–32)
Calcium: 9.2 mg/dL (ref 8.9–10.3)
Chloride: 102 mmol/L (ref 98–111)
Creatinine, Ser: 0.89 mg/dL (ref 0.44–1.00)
GFR, Estimated: >60 mL/min (ref >60)
Glucose, Bld: 240 mg/dL — ABNORMAL HIGH (ref 70–99)
Potassium: 3.8 mmol/L (ref 3.5–5.1)
Sodium: 138 mmol/L (ref 135–145)
Total Bilirubin: 0.3 mg/dL (ref 0.3–1.2)
Total Protein: 7.2 g/dL (ref 6.5–8.1)

## 2020-07-10 LAB — CHLAMYDIA/NGC RT PCR (ARMC ONLY)
Chlamydia Tr: NOT DETECTED
N gonorrhoeae: NOT DETECTED

## 2020-07-10 LAB — HCG, QUANTITATIVE, PREGNANCY: hCG, Beta Chain, Quant, S: 1 m[IU]/mL (ref <5)

## 2020-07-10 LAB — LIPASE, BLOOD: Lipase: 52 U/L — ABNORMAL HIGH (ref 11–51)

## 2020-07-10 LAB — POC URINE PREG, ED: Preg Test, Ur: NEGATIVE

## 2020-07-10 MED ORDER — KETOROLAC TROMETHAMINE 60 MG/2ML IM SOLN
30.0000 mg | Freq: Once | INTRAMUSCULAR | Status: AC
  Administered 2020-07-10: 30 mg via INTRAMUSCULAR
  Filled 2020-07-10: qty 2

## 2020-07-10 NOTE — ED Triage Notes (Signed)
Pt arrived via POV with reports of LLQ abd pain. Pt reports discomfort when urinating as well. Pt states the pain is a stabbing pain. Denies any N/V/D.  Pt reports similar pain in 2014 when she had an ectopic pregnancy. Possibility she could be pregnant now.

## 2020-07-10 NOTE — Discharge Instructions (Signed)
Use Tylenol for pain and fevers.  Up to 1000 mg per dose, up to 4 times per day.  Do not take more than 4000 mg of Tylenol/acetaminophen within 24 hours..  Use naproxen/Aleve for anti-inflammatory pain relief. Use up to 500mg every 12 hours. Do not take more frequently than this. Do not use other NSAIDs (ibuprofen, Advil) while taking this medication. It is safe to take Tylenol with this.   

## 2020-07-10 NOTE — ED Notes (Signed)
Lab aware of add on hCG

## 2020-07-10 NOTE — ED Provider Notes (Signed)
[facility] Emergency Department Provider Note   ____________________________________________   Event Date/Time   First MD Initiated Contact with Patient 07/10/20 [phone]     (approximate)  I have reviewed the triage vital signs and the nursing notes.   HISTORY  Chief Complaint Abdominal Pain    HPI Meredith Rasmussen is a 35 y.o. female who presents to the ED from home with a chief complaint of left pelvic pain.  Patient was up and getting ready for the day when she experienced sudden onset sharp, nonradiating left adnexal pain.  Denies associated fever, chest pain, shortness of breath, nausea, vomiting, vaginal discharge or diarrhea.  Endorses some dysuria.  States pain feels similarly in 2014 when she had an ectopic pregnancy.  Reports irregular menstrual cycles.     Past Medical History:  Diagnosis Date  . Diabetes mellitus without complication (HCC)   . Ectopic pregnancy     Patient Active Problem List   Diagnosis Date Noted  . Irregular contractions 01/17/2015  . Labor and delivery, indication for care 12/21/2014  . Labor and delivery indication for care or intervention 12/01/2014  . Abdominal pain 10/16/2014    Past Surgical History:  Procedure Laterality Date  . ECTOPIC PREGNANCY SURGERY      Prior to Admission medications   Medication Sig Start Date End Date Taking? Authorizing Provider  amoxicillin (AMOXIL) 875 MG tablet Take 1 tablet (875 mg total) by mouth 2 (two) times daily. X 10 days 04/09/18   Evon Slack, PA-C  calcium carbonate (TUMS - DOSED IN MG ELEMENTAL CALCIUM) 500 MG chewable tablet Chew 1 tablet by mouth daily.    [provider]  docusate sodium (COLACE) 100 MG capsule Take 100 mg by mouth 2 (two) times daily.    [provider]  famotidine (PEPCID) 40 MG tablet Take 1 tablet (40 mg total) by mouth every evening. 05/16/17 05/16/18  Rebecka Apley, MD  ferrous fumarate (HEMOCYTE - 106 MG FE) 325 (106  FE) MG TABS tablet Take 1 tablet by mouth.    [provider]  glipiZIDE (GLUCOTROL) 5 MG tablet Take 2 tablets (10 mg total) by mouth daily before breakfast. 03/30/18   Don Perking, [place], MD  ibuprofen (ADVIL,MOTRIN) 600 MG tablet Take 1 tablet (600 mg total) by mouth every 6 (six) hours as needed for moderate pain. 04/09/18   Evon Slack, PA-C  metFORMIN (GLUCOPHAGE) 1000 MG tablet Take 1,000 mg by mouth 2 (two) times daily with a meal.    [provider]  sucralfate (CARAFATE) 1 g tablet Take 1 tablet (1 g total) by mouth 2 (two) times daily. 05/16/17   Rebecka Apley, MD  traMADol (ULTRAM) 50 MG tablet Take 1 tablet (50 mg total) by mouth every 6 (six) hours as needed. 04/09/18   Evon Slack, PA-C    Allergies Januvia [sitagliptin]  No family history on file.  Social History Social History   Tobacco Use  . Smoking status: Never Smoker  . Smokeless tobacco: Never Used  Vaping Use  . Vaping Use: Never used  Substance Use Topics  . Alcohol use: No  . Drug use: No    Review of Systems  Constitutional: No fever/chills Eyes: No visual changes. ENT: No sore throat. Cardiovascular: Denies chest pain. Respiratory: Denies shortness of breath. Gastrointestinal: Positive for left adnexal pain.  No abdominal pain.  No nausea, no vomiting.  No diarrhea.  No constipation. Genitourinary: Positive for dysuria. Musculoskeletal: Negative for  back pain. Skin: Negative for rash. Neurological: Negative for headaches, focal weakness or numbness.   ____________________________________________   PHYSICAL EXAM:  VITAL SIGNS: ED Triage Vitals  Enc Vitals Group     BP 07/10/20 0124 118/78     Pulse Rate 07/10/20 0124 (!) 55     Resp 07/10/20 0124 18     Temp 07/10/20 0124 98.4 F (36.9 C)     Temp Source 07/10/20 0124 Oral     SpO2 07/10/20 0124 99 %     Weight 07/10/20 0121 175 lb (79.4 kg)     Height 07/10/20 0121 4\' 11"  (1.499 m)     Head Circumference  --      Peak Flow --      Pain Score 07/10/20 0121 8     Pain Loc --      Pain Edu? --      Excl. in GC? --     Constitutional: Alert and oriented. Well appearing and in no acute distress. Eyes: Conjunctivae are normal. PERRL. EOMI. Head: Atraumatic. Nose: No congestion/rhinnorhea. Mouth/Throat: Mucous membranes are moist.  Oropharynx non-erythematous. Neck: No stridor.   Cardiovascular: Normal rate, regular rhythm. Grossly normal heart sounds.  Good peripheral circulation. Respiratory: Normal respiratory effort.  No retractions. Lungs CTAB. Gastrointestinal: Soft and mildly tender to palpation left pelvis without rebound or guard. No distention. No abdominal bruits. No CVA tenderness. Musculoskeletal: No lower extremity tenderness nor edema.  No joint effusions. Neurologic:  Normal speech and language. No gross focal neurologic deficits are appreciated. No gait instability. Skin:  Skin is warm, dry and intact. No rash noted. Psychiatric: Mood and affect are normal. Speech and behavior are normal.  ____________________________________________   LABS (all labs ordered are listed, but only abnormal results are displayed)  Labs Reviewed  LIPASE, BLOOD - Abnormal; Notable for the following components:      Result Value   Lipase 52 (*)    All other components within normal limits  COMPREHENSIVE METABOLIC PANEL - Abnormal; Notable for the following components:   Glucose, Bld 240 (*)    All other components within normal limits  CBC - Abnormal; Notable for the following components:   HCT 35.6 (*)    All other components within normal limits  URINALYSIS, COMPLETE (UACMP) WITH MICROSCOPIC - Abnormal; Notable for the following components:   Color, Urine YELLOW (*)    APPearance CLEAR (*)    Glucose, UA 50 (*)    All other components within normal limits  CHLAMYDIA/NGC RT PCR ([facility] ONLY)  WET PREP, GENITAL  HCG, QUANTITATIVE, PREGNANCY  POC URINE PREG, ED    ____________________________________________  EKG  None ____________________________________________  RADIOLOGY I, Lucindy Borel J, personally viewed and evaluated these images (plain radiographs) as part of my medical decision making, as well as reviewing the written report by the radiologist.  ED MD interpretation: Pending  Official radiology report(s): No results found.  ____________________________________________   PROCEDURES  Procedure(s) performed (including Critical Care):  Procedures   ____________________________________________   INITIAL IMPRESSION / ASSESSMENT AND PLAN / ED COURSE  As part of my medical decision making, I reviewed the following data within the electronic MEDICAL RECORD NUMBER Nursing notes reviewed and incorporated, Labs reviewed, Old chart reviewed and Notes from prior ED visits     35 year old female presenting with left adnexal pain. Differential diagnosis includes, but is not limited to, ovarian cyst, ovarian torsion, acute appendicitis, diverticulitis, urinary tract infection/pyelonephritis, endometriosis, bowel obstruction, colitis, renal colic, gastroenteritis, hernia, fibroids, endometriosis,  pregnancy related pain including ectopic pregnancy, etc.  Laboratory results unremarkable.  Patient will self swab for STI panel.  Given her irregular periods, will check beta hCG.  We will proceed with pelvic ultrasound.  Administer IM Toradol for pain.  Will reassess    Clinical Course as of 07/10/20 0704  Thu Jul 10, 2020  1751 Care transferred to Dr. Katrinka Blazing pending STI and [place] results.  [JS]    Clinical Course User Index [JS] Irean Hong, MD     ____________________________________________   FINAL CLINICAL IMPRESSION(S) / ED DIAGNOSES  Final diagnoses:  Pain  Pelvic pain in female     ED Discharge Orders    None      *Please note:  Meredith Rasmussen was evaluated in Emergency Department on 07/10/2020 for the symptoms described in the  history of present illness. She was evaluated in the context of the global COVID-19 pandemic, which necessitated consideration that the patient might be at risk for infection with the SARS-CoV-2 virus that causes COVID-19. Institutional protocols and algorithms that pertain to the evaluation of patients at risk for COVID-19 are in a state of rapid change based on information released by regulatory bodies including the CDC and federal and state organizations. These policies and algorithms were followed during the patient's care in the ED.  Some ED evaluations and interventions may be delayed as a result of limited staffing during and the pandemic.*   Note:  This document was prepared using Dragon voice recognition software and may include unintentional dictation errors.   Irean Hong, MD 07/10/20 [phone]

## 2020-07-10 NOTE — ED Provider Notes (Signed)
  Patient received in signout from Dr. Dolores Frame pending pelvic ultrasound for evaluation of left adnexal pain.  Ultrasound reviewed by me with no ovarian cyst or acute pathology, known fibroid is noted.  I reevaluate the patient and she has a benign abdominal exam, resting comfortably with normal vital signs.  We discussed largely benign work-up and pending GC swab.  She requests discharge due to having an outpatient oncology visit with her boyfriend in the next 1 hour.  We discussed return precautions for the ED and patient stable for outpatient management.   Delton Prairie, MD 07/10/20 [phone]

## 2020-07-10 NOTE — ED Notes (Signed)
Pt in [place] at this time. Per Dr. Dolores Frame, pt okay to self swab for [facility] and wet prep collection when she returns

## 2020-10-15 IMAGING — US US PELVIS COMPLETE WITH TRANSVAGINAL
1 series · 13 of 25 positions shown · non-contrast
Comparison: None available.

CLINICAL DATA: Initial evaluation for acute right-sided pelvic pain
for 2 days.



[Series 1: us pelvis complete with transvaginal · 91 acquisitions, 13 frames shown]
[im 1/91]
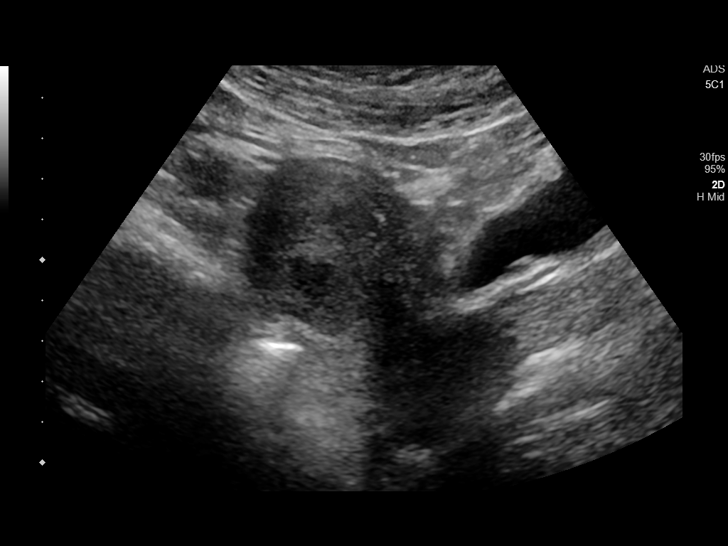
[im 8/91]
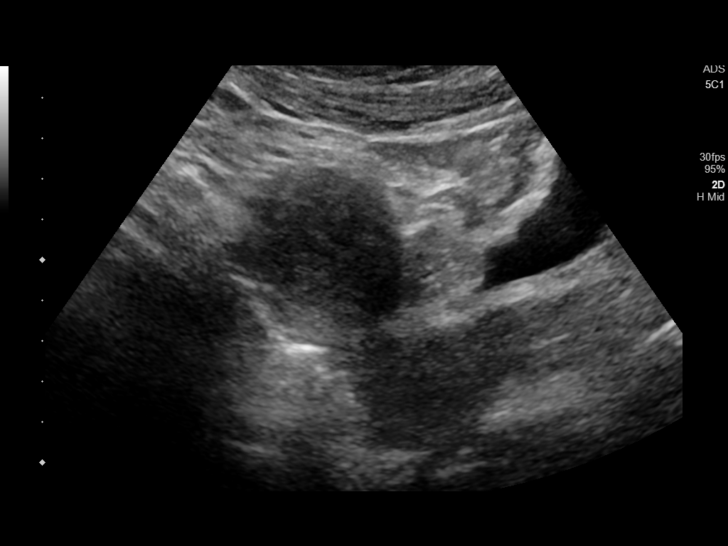
[im 16/91]
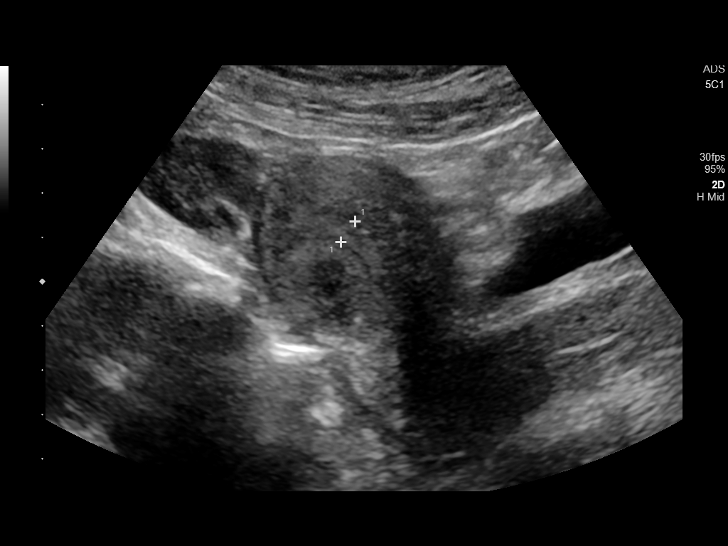
[im 23/91]
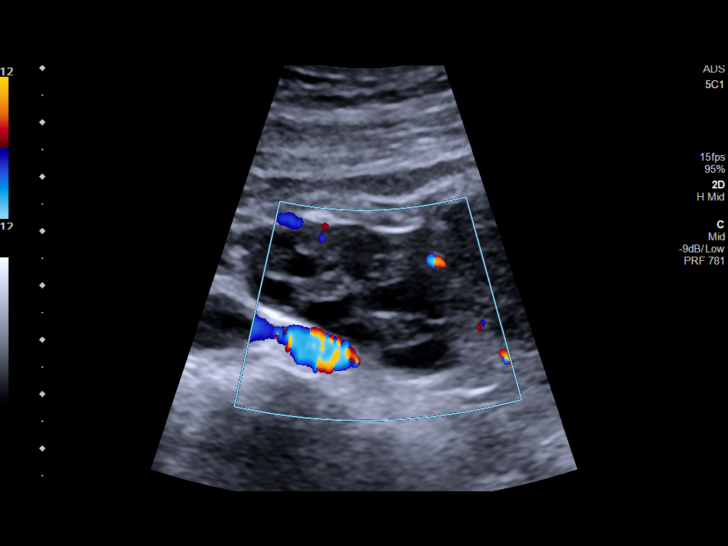
[im 31/91]
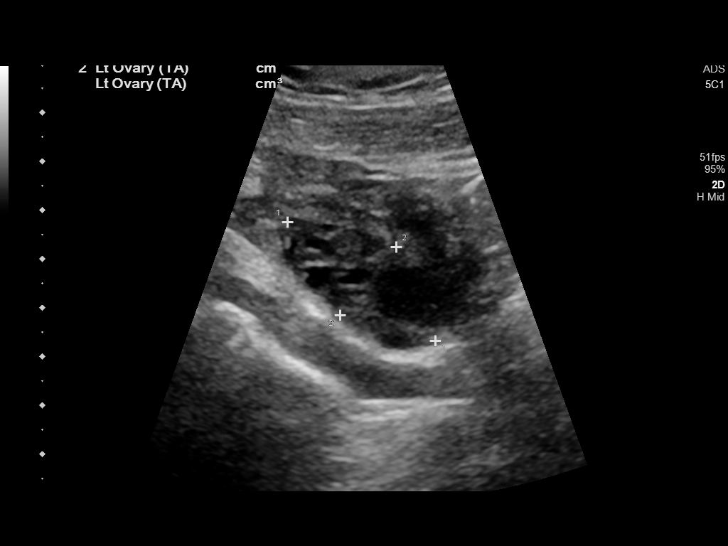
[im 38/91]
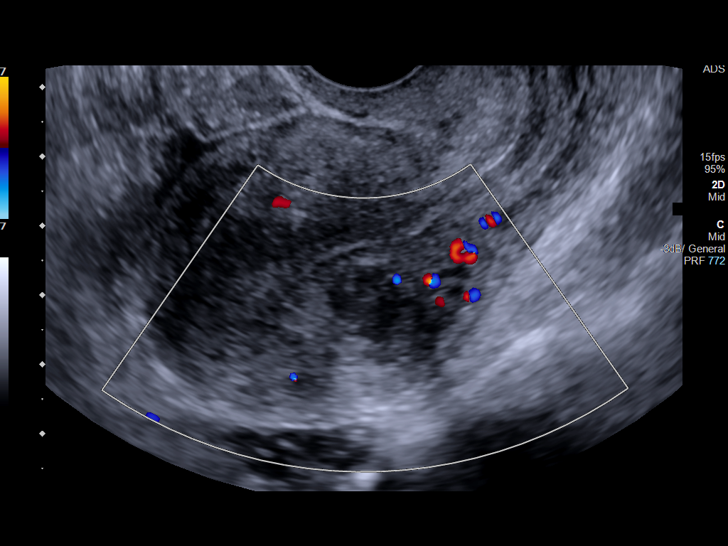
[im 46/91]
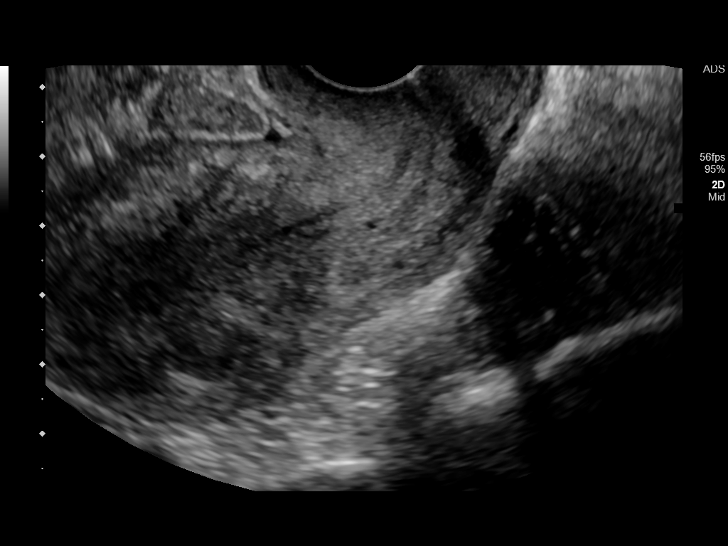
[im 53/91]
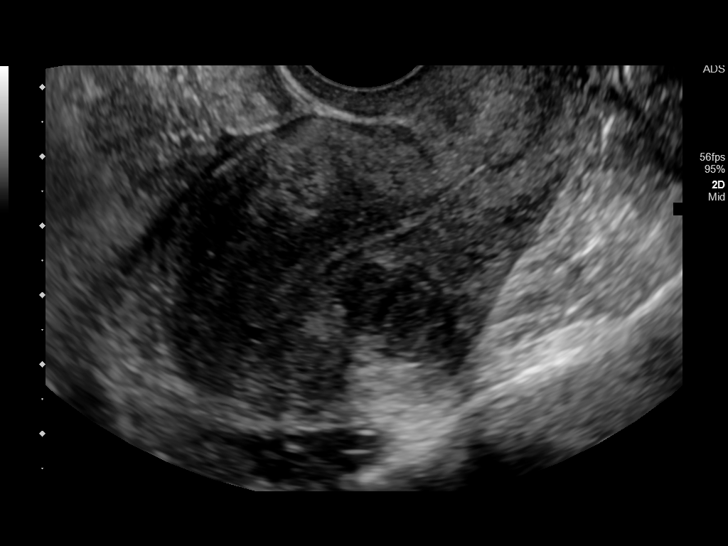
[im 61/91]
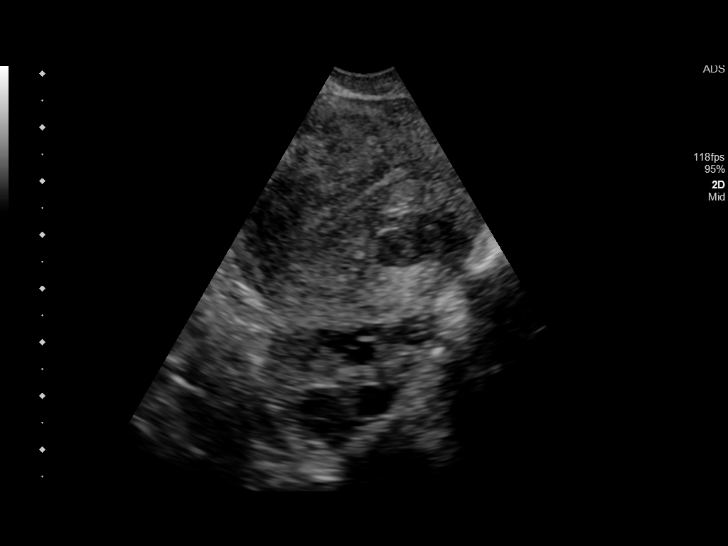
[im 68/91]
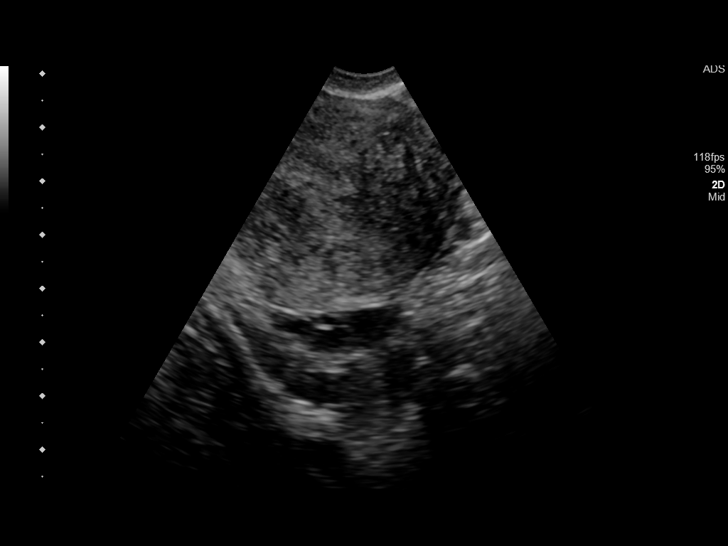
[im 76/91]
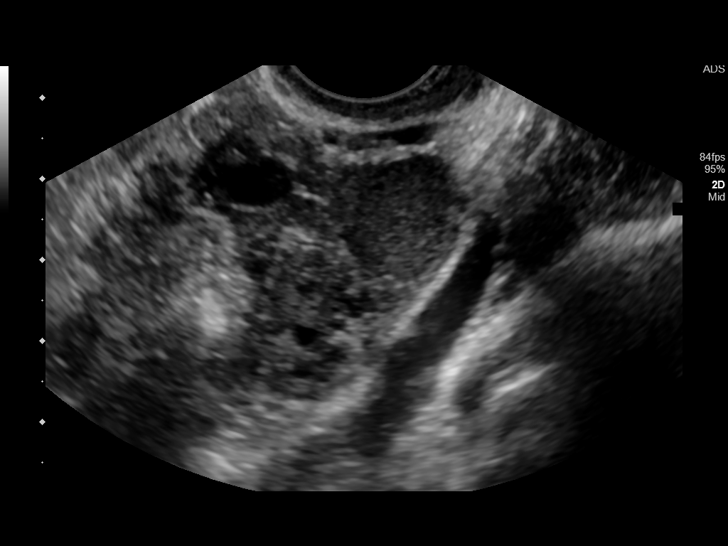
[im 83/91]
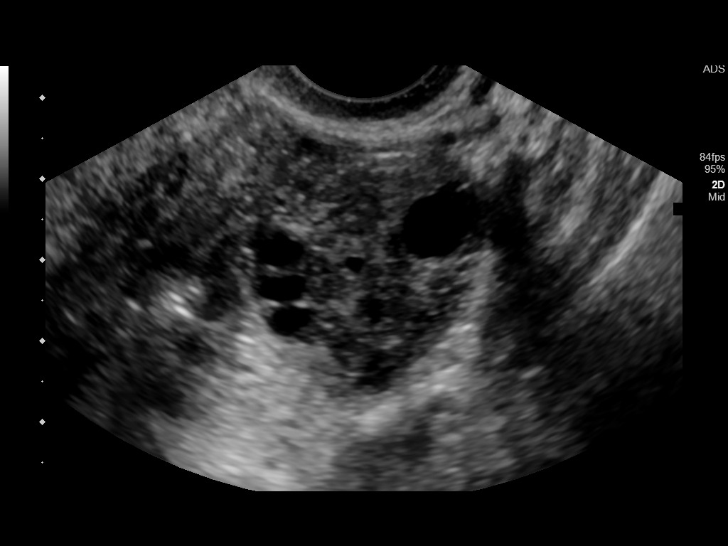
[im 91/91]
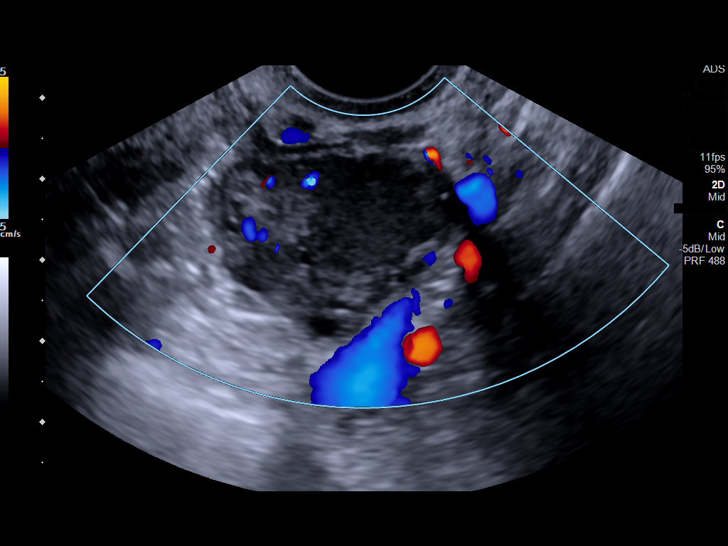

[13 of 25 positions shown; findings below may reference images not displayed]

FINDINGS: Uterus

Measurements: 8.7 x 4.5 x 5.2 cm = volume: 107.2 mL. 1.8 x 1.7 x
cm intramural fibroid present at the right posterior mid uterine
body.

Endometrium

Thickness: 6 mm.  No focal abnormality visualized.

Right ovary

Measurements: 4.2 x 2.2 x 2.2 cm = volume: 10.8 mL. Normal
appearance. No adnexal mass.

Left ovary

Measurements: 3.5 x 1.9 x 3.0 cm = volume: 10.7 mL. 1.3 x 1.5 x
cm round well-circumscribed hypoechoic lesion positioned within the
left ovary, indeterminate, but could reflect a small complex
physiologic follicular cyst versus hemorrhagic cyst, or possibly
endometrioma.

Other findings

Small volume free fluid present within the pelvis.
IMPRESSION: 1. 1.5 cm complex left ovarian hypoechoic lesion, indeterminate.
Differential considerations include a complex physiologic follicular
cyst versus hemorrhagic cyst, or possibly endometrioma. Short
interval follow-up ultrasound in 6-12 weeks suggested for further
evaluation she.
2. 1.8 cm intramural fibroid at the right mid uterine body.
3. Otherwise unremarkable and normal pelvic ultrasound. No other
findings to explain patient's symptoms identified.

## 2022-01-09 IMAGING — US US PELVIS COMPLETE TRANSABD/TRANSVAG W DUPLEX
1 series · 13 of 25 positions shown · non-contrast
Comparison: Ultrasound 04/16/2019.

CLINICAL DATA: Left lower quadrant pain. Negative urine pregnancy
test.

EXAM:
TRANSABDOMINAL AND TRANSVAGINAL ULTRASOUND OF PELVIS
DOPPLER ULTRASOUND OF OVARIES
TECHNIQUE: Both transabdominal and transvaginal ultrasound examinations of the
pelvis were performed. Transabdominal technique was performed for
global imaging of the pelvis including uterus, ovaries, adnexal
regions, and pelvic cul-de-sac.
It was necessary to proceed with endovaginal exam following the
transabdominal exam to visualize the uterus and ovaries. Color and
duplex Doppler ultrasound was utilized to evaluate blood flow to the
ovaries.

[Series 1: us pelvic complete w transvaginal and torsion righ · 13 of 114 slices shown]
[im 1/114]
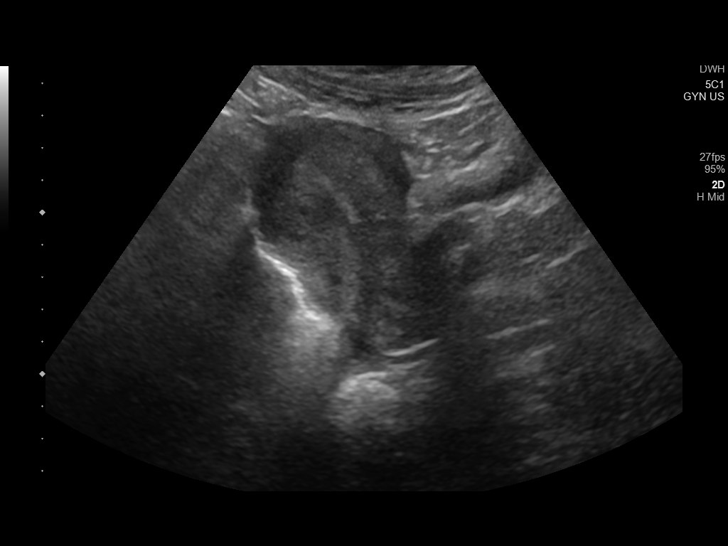
[im 10/114]
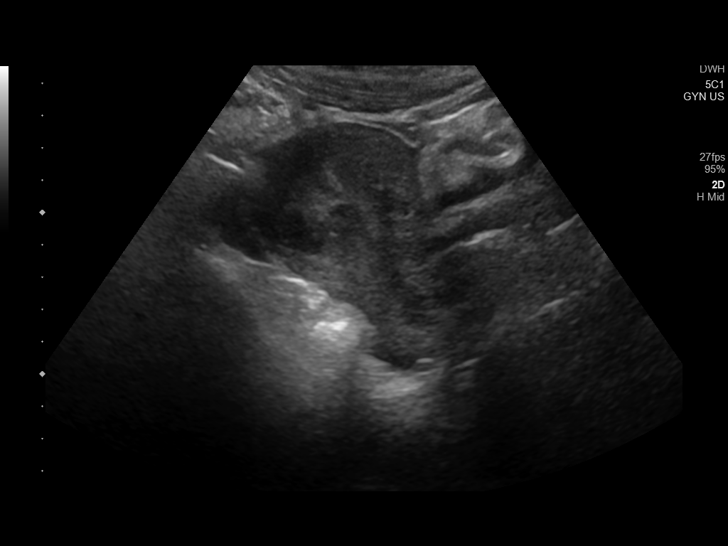
[im 19/114]
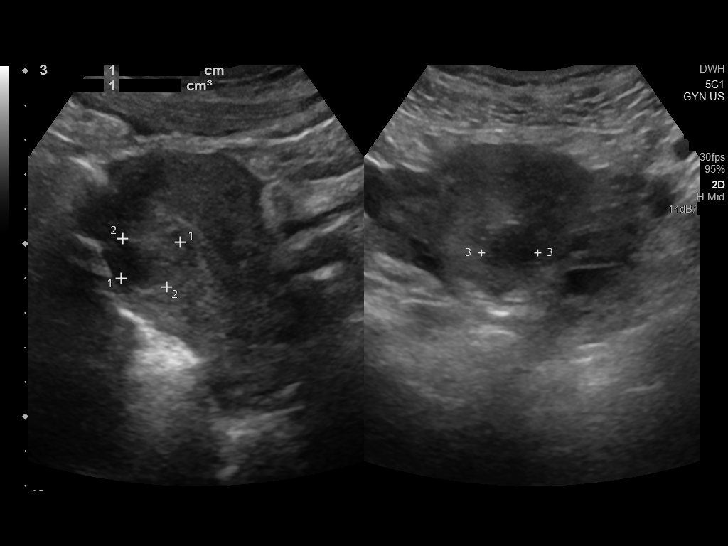
[im 29/114]
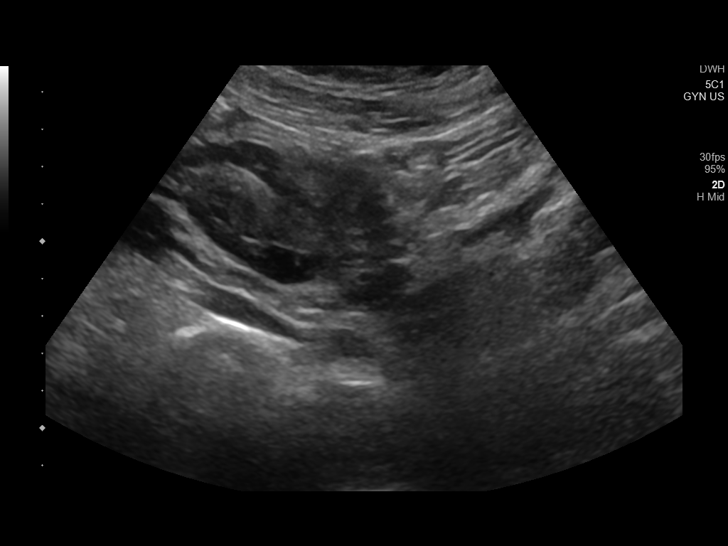
[im 38/114]
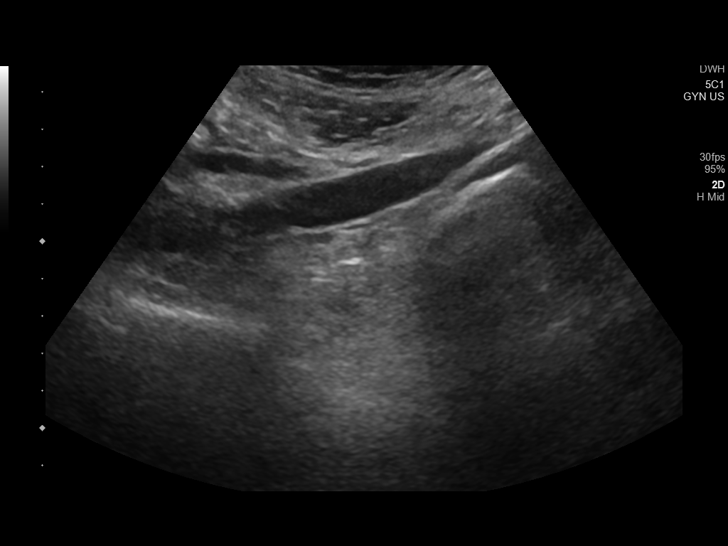
[im 48/114]
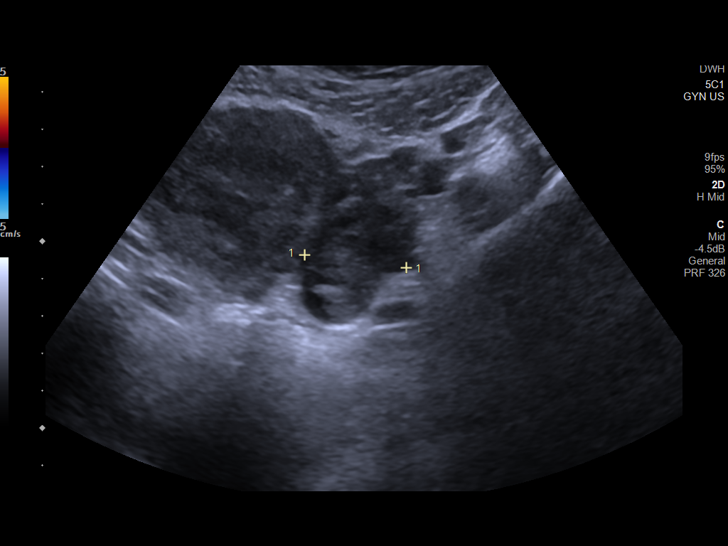
[im 57/114]
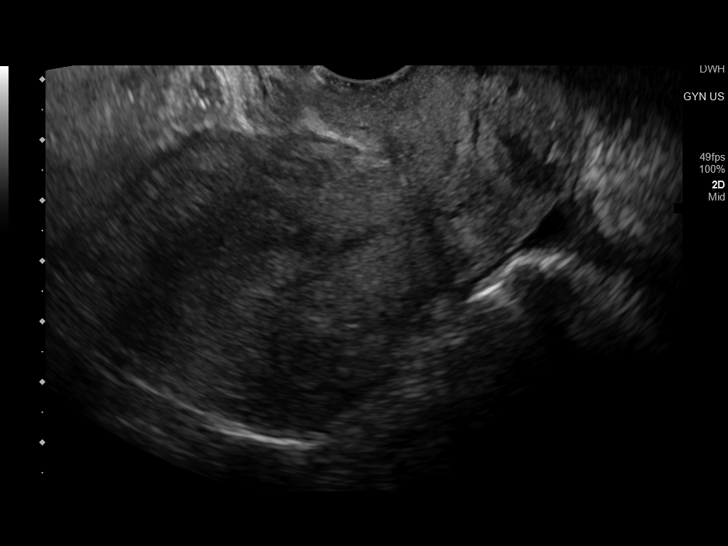
[im 66/114]
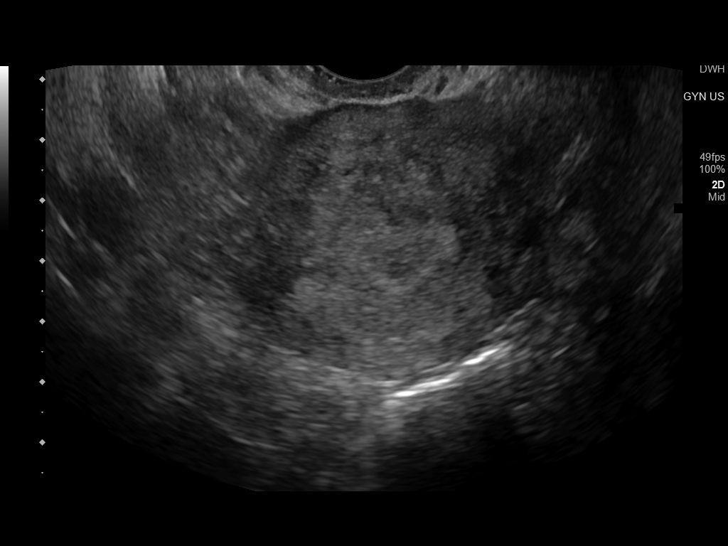
[im 76/114]
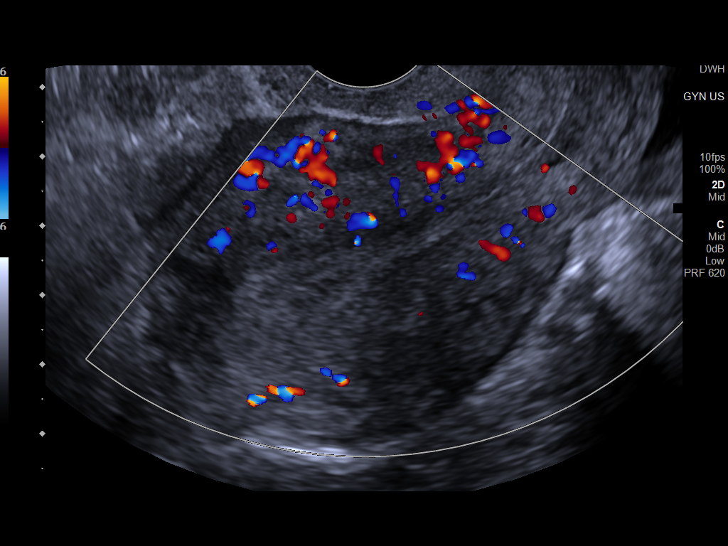
[im 85/114]
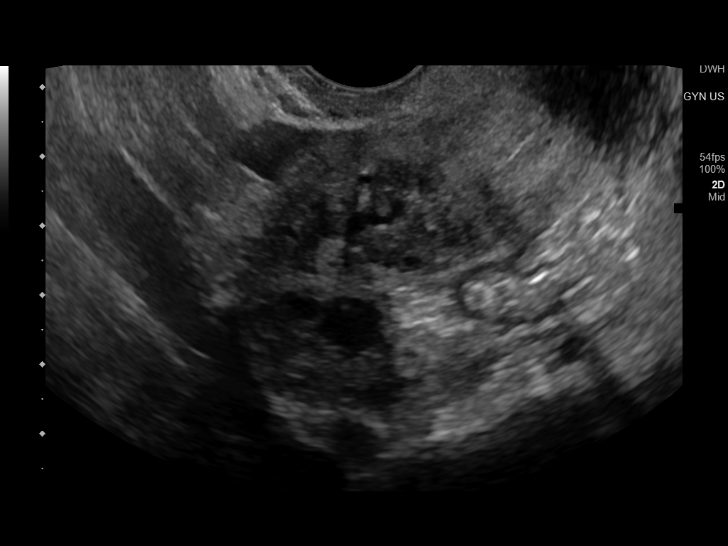
[im 95/114]
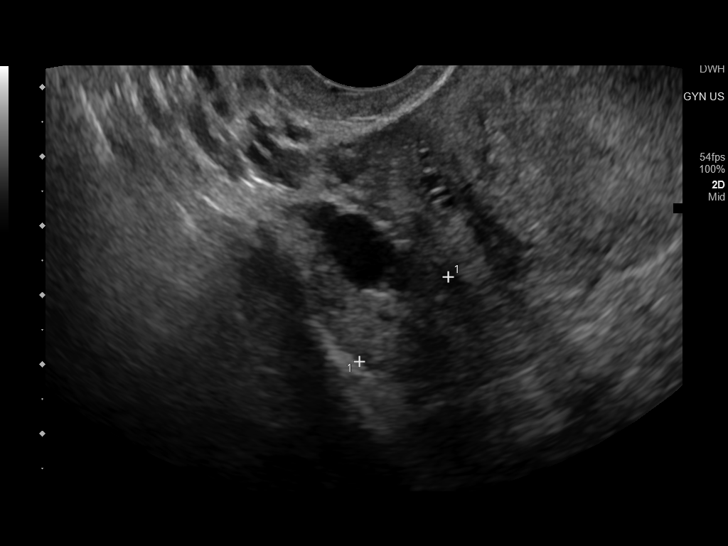
[im 104/114]
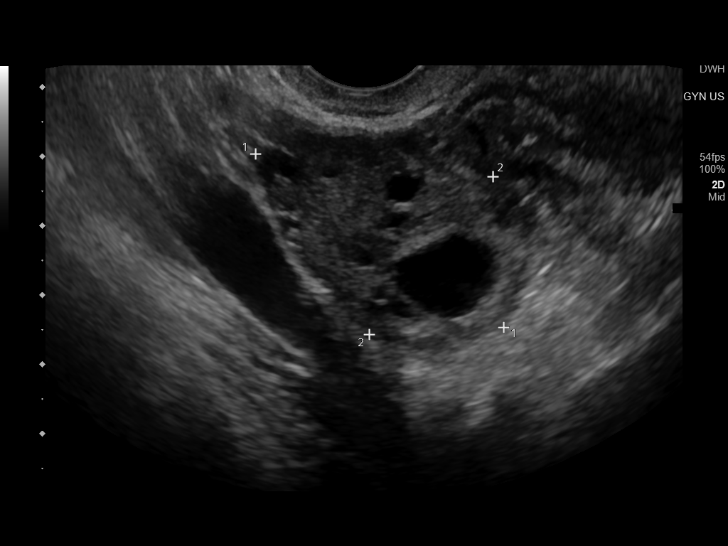
[im 114/114]
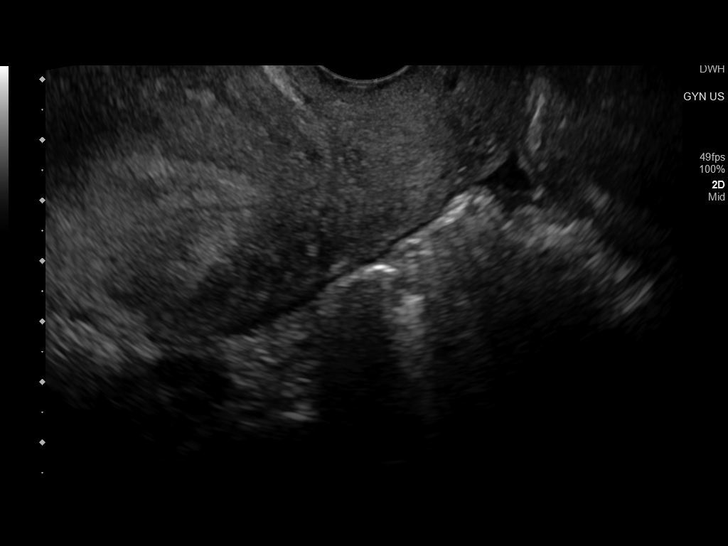

[13 of 25 positions shown; findings below may reference images not displayed]

FINDINGS: Uterus

Measurements: 8.5 x 5.0 x 5.1 cm = volume: 112.3 mL. 2.4 cm
posterior fibroid again noted.

Endometrium

Thickness: 9.2 mm.  No focal abnormality visualized.

Right ovary

Measurements: 3.9 x 2.4 x 1.8 cm = volume: 8.6 mL. Normal
appearance/no adnexal mass.

Left ovary

Measurements: 4.4 x 2.9 x 2.1 cm = volume: 14.0 mL. Normal
appearance/no adnexal mass. Previously identified complex left
ovarian lesion no longer identified.

Pulsed Doppler evaluation of both ovaries demonstrates normal
low-resistance arterial and venous waveforms.

Other findings

Fluid.
IMPRESSION: 1.  2.4 cm posterior uterine fibroid again noted.

2. Previous identified left ovarian complex lesion no longer
identified. Ovaries appear unremarkable. No evidence of torsion.

3.  Trace free pelvic fluid.

## 2022-02-02 ENCOUNTER — Other Ambulatory Visit: Payer: Self-pay

## 2022-02-02 ENCOUNTER — Encounter: Payer: Self-pay | Admitting: Emergency Medicine

## 2022-02-02 ENCOUNTER — Emergency Department
Admission: EM | Admit: 2022-02-02 | Discharge: 2022-02-02 | Disposition: A | Discharge status: Home / Self Care | Payer: Medicaid Other | Attending: Emergency Medicine | Admitting: Emergency Medicine

## 2022-02-02 DIAGNOSIS — K029 Dental caries, unspecified: Secondary | ICD-10-CM | POA: Diagnosis not present

## 2022-02-02 DIAGNOSIS — K0889 Other specified disorders of teeth and supporting structures: Secondary | ICD-10-CM | POA: Insufficient documentation

## 2022-02-02 MED ORDER — PENICILLIN V POTASSIUM 500 MG PO TABS: 500.0000 mg | ORAL_TABLET | Freq: Four times a day (QID) | ORAL | 0 refills | Status: AC

## 2022-02-02 MED ORDER — LIDOCAINE-EPINEPHRINE 2 %-1:100000 IJ SOLN
1.7000 mL | Freq: Once | INTRAMUSCULAR | Status: AC
  Administered 2022-02-02: 1.7 mL
  Filled 2022-02-02: qty 1.7

## 2022-02-02 NOTE — ED Triage Notes (Signed)
Pt states tooth pain for the last 2 months. Pt states the pain has increased over the last 2 days. Pt states taking tylenol and motrin without relief. Pt states tooth is back bottom to the left.

## 2022-02-02 NOTE — Discharge Instructions (Addendum)
Pain control:  Ibuprofen (motrin/aleve/advil) - You can take 3-4 tablets (600-800 mg) every 6 hours as needed for pain/fever.  Acetaminophen (tylenol) - You can take 2 extra strength tablets (1000 mg) every 6 hours as needed for pain/fever.  You can alternate these medications or take them together.  Make sure you eat food/drink water when taking these medications. 

## 2022-02-02 NOTE — ED Provider Notes (Signed)
[facility] Provider Note    Event Date/Time   First MD Initiated Contact with Patient 02/02/22 [phone]     (approximate)   History   Dental Pain   HPI  Meredith Rasmussen is a 36 y.o. female presents to the emergency department with dental pain.  2 months of dental pain that has been progressively worsening.  Back tooth chipped off.  Denies any fever or chills.  States that she called her dentist denies appointment tomorrow.  Alternating Motrin and Tylenol without significant pain improvement.  Denies any trouble swallowing.     Physical Exam   Triage Vital Signs: ED Triage Vitals  Enc Vitals Group     BP 02/02/22 0707 117/74     Pulse Rate 02/02/22 0707 85     Resp 02/02/22 0707 17     Temp 02/02/22 0707 98.4 F (36.9 C)     Temp Source 02/02/22 0707 Oral     SpO2 02/02/22 0707 99 %     Weight 02/02/22 0709 180 lb (81.6 kg)     Height 02/02/22 0709 4\' 11"  (1.499 m)     Head Circumference --      Peak Flow --      Pain Score 02/02/22 0708 10     Pain Loc --      Pain Edu? --      Excl. in GC? --     Most recent vital signs: Vitals:   02/02/22 0707  BP: 117/74  Pulse: 85  Resp: 17  Temp: 98.4 F (36.9 C)  SpO2: 99%    Physical Exam Constitutional:      Appearance: She is well-developed.  HENT:     Head: Atraumatic.     Mouth/Throat:     Dentition: Dental tenderness and dental caries present.  Eyes:     Conjunctiva/sclera: Conjunctivae normal.  Cardiovascular:     Rate and Rhythm: Regular rhythm.  Pulmonary:     Effort: No respiratory distress.  Abdominal:     General: There is no distension.  Musculoskeletal:        General: Normal range of motion.     Cervical back: Normal range of motion.  Skin:    General: Skin is warm.  Neurological:     Mental Status: She is alert. Mental status is at baseline.     IMPRESSION / MDM / ASSESSMENT AND PLAN / ED COURSE  I reviewed the triage vital signs and the nursing  notes.  Differential diagnosis including dental caries, apical abscess, avulsion, no signs of Ludwig's, no significant trismus on exam.  Dental block was performed with significant improvement of pain.  Discussed NSAIDs and Tylenol.  Patient has an appointment tomorrow with her dentist.  Given return precautions for any signs of a dental infection.  No questions at time of discharge.  Will give the prescription for penicillin.   LABS (all labs ordered are listed, but only abnormal results are displayed) Labs interpreted as -    Labs Reviewed - No data to display  TREATMENT      PROCEDURES:  Critical Care performed: No  Dental Block  Date/Time: 02/02/2022 8:19 AM  Performed by: 02/04/2022, MD Authorized by: Corena Herter, MD   Consent:    Consent obtained:  Verbal   Consent given by:  Patient   Risks, benefits, and alternatives were discussed: yes     Risks discussed:  Unsuccessful block, swelling, intravascular injection, hematoma and allergic reaction  Alternatives discussed:  No treatment Universal protocol:    Patient identity confirmed:  Verbally with patient Indications:    Indications: dental pain   Location:    Block type:  Inferior alveolar   Laterality:  Left Procedure details:    Syringe type:  Luer lock syringe   Needle gauge:  27 G   Anesthetic injected:  Bupivacaine 0.25% WITH epi   Injection procedure:  Anatomic landmarks identified Post-procedure details:    Outcome:  Anesthesia achieved   Procedure completion:  Tolerated well, no immediate complications   Patient's presentation is most consistent with acute illness / injury with system symptoms.   MEDICATIONS ORDERED IN ED: Medications  lidocaine-EPINEPHrine (XYLOCAINE W/EPI) 2 %-1:100000 (with pres) injection 1.7 mL (has no administration in time range)    FINAL CLINICAL IMPRESSION(S) / ED DIAGNOSES   Final diagnoses:  Pain, dental     Rx / DC Orders   ED Discharge Orders           Ordered    penicillin v potassium (VEETID) 500 MG tablet  4 times daily        02/02/22 0819             Note:  This document was prepared using Dragon voice recognition software and may include unintentional dictation errors.   Nathaniel Man, MD 02/02/22 [phone]

## 2022-05-12 ENCOUNTER — Encounter: Payer: Self-pay | Admitting: *Deleted

## 2022-05-12 ENCOUNTER — Other Ambulatory Visit: Payer: Self-pay

## 2022-05-12 DIAGNOSIS — J020 Streptococcal pharyngitis: Secondary | ICD-10-CM | POA: Diagnosis not present

## 2022-05-12 DIAGNOSIS — J029 Acute pharyngitis, unspecified: Secondary | ICD-10-CM | POA: Diagnosis present

## 2022-05-12 DIAGNOSIS — M79643 Pain in unspecified hand: Secondary | ICD-10-CM | POA: Insufficient documentation

## 2022-05-12 DIAGNOSIS — M79673 Pain in unspecified foot: Secondary | ICD-10-CM | POA: Diagnosis not present

## 2022-05-12 LAB — GROUP A STREP BY PCR: Group A Strep by PCR: DETECTED — AB

## 2022-05-12 NOTE — ED Triage Notes (Signed)
Pt reports pain in feet and hands.  Pt also has a sore throat.  Sx began last night.    Pt alert

## 2022-05-13 ENCOUNTER — Emergency Department
Admission: EM | Admit: 2022-05-13 | Discharge: 2022-05-13 | Disposition: A | Discharge status: Home / Self Care | Payer: Medicaid Other | Attending: Emergency Medicine | Admitting: Emergency Medicine

## 2022-05-13 DIAGNOSIS — J020 Streptococcal pharyngitis: Secondary | ICD-10-CM

## 2022-05-13 LAB — CBG MONITORING, ED: Glucose-Capillary: 250 mg/dL — ABNORMAL HIGH (ref 70–99)

## 2022-05-13 MED ORDER — AMOXICILLIN 500 MG PO CAPS
1000.0000 mg | ORAL_CAPSULE | Freq: Once | ORAL | Status: AC
  Administered 2022-05-13: 1000 mg via ORAL
  Filled 2022-05-13: qty 2

## 2022-05-13 MED ORDER — AMOXICILLIN 500 MG PO CAPS: 500.0000 mg | ORAL_CAPSULE | Freq: Two times a day (BID) | ORAL | 0 refills | Status: AC

## 2022-05-13 NOTE — ED Provider Notes (Signed)
[facility] Provider Note    Event Date/Time   First MD Initiated Contact with Patient 05/13/22 0017     (approximate)   History   Sore Throat and Hand Pain   HPI  Meredith Rasmussen is a 37 y.o. female past medical history significant for diabetes who presents to the emergency department with sore throat and not feeling well.  States that she started not feel well yesterday.  Complaining of a headache, sore throat and pains to her hands and feet.  Denies any fever.  Decreased oral intake today.  States that it hurts to swallow.  Denies any fever.  No recent antibiotic use.  Denies any chest pain or shortness of breath at this time.  Denies any abdominal pain nausea vomiting or diarrhea.  No trouble tolerating her spit.       Physical Exam   Triage Vital Signs: ED Triage Vitals  Enc Vitals Group     BP 05/12/22 2240 113/73     Pulse Rate 05/12/22 2240 (!) 112     Resp 05/12/22 2240 20     Temp 05/12/22 2240 99.1 F (37.3 C)     Temp Source 05/12/22 2240 Oral     SpO2 05/12/22 2240 98 %     Weight 05/12/22 2237 176 lb (79.8 kg)     Height 05/12/22 2237 4\' 11"  (1.499 m)     Head Circumference --      Peak Flow --      Pain Score 05/12/22 2237 6     Pain Loc --      Pain Edu? --      Excl. in [place]? --     Most recent vital signs: Vitals:   05/12/22 2240  BP: 113/73  Pulse: (!) 112  Resp: 20  Temp: 99.1 F (37.3 C)  SpO2: 98%    Physical Exam Constitutional:      Appearance: She is well-developed.  HENT:     Head: Atraumatic.     Mouth/Throat:     Pharynx: Oropharyngeal exudate and posterior oropharyngeal erythema present.     Tonsils: Tonsillar exudate present. No tonsillar abscesses. 2+ on the right. 2+ on the left.     Comments: Uvula is midline.  Tolerating secretions.  No trismus. Eyes:     Conjunctiva/sclera: Conjunctivae normal.  Cardiovascular:     Rate and Rhythm: Regular rhythm.  Pulmonary:     Effort: No respiratory  distress.  Abdominal:     General: There is no distension.  Musculoskeletal:        General: Normal range of motion.     Cervical back: Normal range of motion.  Skin:    General: Skin is warm.  Neurological:     Mental Status: She is alert. Mental status is at baseline.      IMPRESSION / MDM / ASSESSMENT AND PLAN / ED COURSE  I reviewed the triage vital signs and the nursing notes.  Differential diagnosis including strep pharyngitis, viral pharyngitis, PTA, RPA, hyperglycemia  Strep test positive.  Uvula is midline with no stridor and no change in voice.  Low suspicion for PTA or RPA.  Glucose elevated in the 250s but no concern for DKA.  Discussed close follow-up with her primary care physician for further management of her diabetes.  Patient was given Motrin and first dose of amoxicillin in the emergency department given no recent antibiotic use.  Initially tachycardic on arrival, likely mild dehydration from her sore  throat.  Discussed oral hydration and close follow-up with primary care provider.  Given return precautions for any worsening symptoms.  Labs (all labs ordered are listed, but only abnormal results are displayed) Labs interpreted as -    Labs Reviewed  GROUP A STREP BY PCR - Abnormal; Notable for the following components:      Result Value   Group A Strep by PCR DETECTED (*)    All other components within normal limits  CBG MONITORING, ED - Abnormal; Notable for the following components:   Glucose-Capillary 250 (*)    All other components within normal limits         PROCEDURES:  Critical Care performed: No  Procedures  Patient's presentation is most consistent with acute illness / injury with system symptoms.   MEDICATIONS ORDERED IN ED: Medications  amoxicillin (AMOXIL) capsule 1,000 mg (1,000 mg Oral Given 05/13/22 0030)    FINAL CLINICAL IMPRESSION(S) / ED DIAGNOSES   Final diagnoses:  Strep pharyngitis     Rx / DC Orders   ED  Discharge Orders          Ordered    amoxicillin (AMOXIL) 500 MG capsule  2 times daily        05/13/22 0028             Note:  This document was prepared using Dragon voice recognition software and may include unintentional dictation errors.   Nathaniel Man, MD 05/13/22 [phone]

## 2022-05-13 NOTE — Discharge Instructions (Signed)
Pain control:  Ibuprofen (motrin/aleve/advil) - You can take 3-4 tablets (600-800 mg) every 6 hours as needed for pain/fever.  Acetaminophen (tylenol) - You can take 2 extra strength tablets (1000 mg) every 6 hours as needed for pain/fever.  You can alternate these medications or take them together.  Make sure you eat food/drink water when taking these medications. 

## 2022-11-15 ENCOUNTER — Ambulatory Visit (LOCAL_COMMUNITY_HEALTH_CENTER): Payer: Self-pay

## 2022-11-15 DIAGNOSIS — Z111 Encounter for screening for respiratory tuberculosis: Secondary | ICD-10-CM

## 2022-11-15 NOTE — Progress Notes (Signed)
Here for ppd, paid for by employer [employer]. Jerel Shepherd, RN

## 2022-11-18 ENCOUNTER — Ambulatory Visit: Payer: Medicaid Other

## 2022-11-18 DIAGNOSIS — Z111 Encounter for screening for respiratory tuberculosis: Secondary | ICD-10-CM

## 2022-11-18 LAB — TB SKIN TEST
Induration: 0 mm
TB Skin Test: NEGATIVE

## 2023-01-24 ENCOUNTER — Ambulatory Visit: Payer: Medicaid Other

## 2023-01-24 VITALS — BP 112/54 | Ht 59.5 in | Wt 171.0 lb

## 2023-01-24 DIAGNOSIS — Z309 Encounter for contraceptive management, unspecified: Secondary | ICD-10-CM | POA: Diagnosis not present

## 2023-01-24 DIAGNOSIS — Z3201 Encounter for pregnancy test, result positive: Secondary | ICD-10-CM | POA: Diagnosis not present

## 2023-01-24 MED ORDER — PRENATAL 27-0.8 MG PO TABS: 1.0000 | ORAL_TABLET | Freq: Every day | ORAL | Status: AC

## 2023-01-24 NOTE — Progress Notes (Signed)
UPT positive. Plans prenatal care at [facility]. Positive preg packet given and reviewed.  Currently Has diabetes and takes Metformin and Jardiance as prescribed by [facility]. Hx ectopic preg. 2014  Consult E Nigil Braman, CNM and informed of patient status and hx. Advises patient to contact prescribing provider for Metformin and Jardiance for medication guidance while pregnant. Also advises patient to establish prenatal care ASAP as she would be considered high risk.   RN discussed provider recommendations and patient is in agreement. Questions answered and reports understanding.   The patient was dispensed prenatal vitamins #100 today per [facility] Dr Lorrin Mais. I provided counseling today regarding the medication. We discussed the medication, the side effects and when to call clinic. Patient given the opportunity to ask questions. Questions answered.    Sent to clerk for eligibility medicaid/preg women. Jerel Shepherd, RN Consulted on the plan of care for this client.  I agree with the documented note and actions taken to provide care for this client.  Hazle Coca, CNM

## 2023-01-24 NOTE — Progress Notes (Signed)
UPT positive. Plans prenatal care at [facility]. Positive preg packet given and reviewed.  Currently Has diabetes and takes Metformin and Jardiance as prescribed by [facility]. Hx ectopic preg. 2014  Consult E Sciora, CNM and informed of patient status and hx. Advises patient to contact prescribing provider for Metformin and Jardiance for medication guidance while pregnant. Also advises patient to establish prenatal care ASAP as she would be considered high risk.   RN discussed provider recommendations and patient is in agreement. Questions answered and reports understanding.   The patient was dispensed prenatal vitamins #100 today per SO Dr Lorrin Mais. I provided counseling today regarding the medication. We discussed the medication, the side effects and when to call clinic. Patient given the opportunity to ask questions. Questions answered.    Sent to clerk for eligibility medicaid/preg women. Jerel Shepherd, RN

## 2023-01-25 LAB — PREGNANCY, URINE: Preg Test, Ur: POSITIVE — AB

## 2023-02-15 ENCOUNTER — Emergency Department
Admission: EM | Admit: 2023-02-15 | Discharge: 2023-02-15 | Disposition: A | Discharge status: Home / Self Care | Payer: Medicaid Other | Attending: Emergency Medicine | Admitting: Emergency Medicine

## 2023-02-15 ENCOUNTER — Other Ambulatory Visit: Payer: Self-pay

## 2023-02-15 ENCOUNTER — Emergency Department: Payer: Medicaid Other

## 2023-02-15 DIAGNOSIS — O200 Threatened abortion: Secondary | ICD-10-CM | POA: Diagnosis present

## 2023-02-15 DIAGNOSIS — N939 Abnormal uterine and vaginal bleeding, unspecified: Secondary | ICD-10-CM | POA: Diagnosis not present

## 2023-02-15 DIAGNOSIS — R1030 Lower abdominal pain, unspecified: Secondary | ICD-10-CM | POA: Insufficient documentation

## 2023-02-15 LAB — COMPREHENSIVE METABOLIC PANEL
ALT: 12 U/L (ref 0–44)
AST: 14 U/L — ABNORMAL LOW (ref 15–41)
Albumin: 4 g/dL (ref 3.5–5.0)
Alkaline Phosphatase: 55 U/L (ref 38–126)
Anion gap: 10 (ref 5–15)
BUN: 14 mg/dL (ref 6–20)
CO2: 21 mmol/L — ABNORMAL LOW (ref 22–32)
Calcium: 9.1 mg/dL (ref 8.9–10.3)
Chloride: 102 mmol/L (ref 98–111)
Creatinine, Ser: 0.66 mg/dL (ref 0.44–1.00)
GFR, Estimated: >60 mL/min (ref >60)
Glucose, Bld: 116 mg/dL — ABNORMAL HIGH (ref 70–99)
Potassium: 3.6 mmol/L (ref 3.5–5.1)
Sodium: 133 mmol/L — ABNORMAL LOW (ref 135–145)
Total Bilirubin: 0.4 mg/dL (ref 0.0–1.2)
Total Protein: 7.5 g/dL (ref 6.5–8.1)

## 2023-02-15 LAB — CBC
HCT: 37.1 % (ref 36.0–46.0)
Hemoglobin: 13.4 g/dL (ref 12.0–15.0)
MCH: 30.9 pg (ref 26.0–34.0)
MCHC: 36.1 g/dL — ABNORMAL HIGH (ref 30.0–36.0)
MCV: 85.7 fL (ref 80.0–100.0)
Platelets: 333 10*3/uL (ref 150–400)
RBC: 4.33 MIL/uL (ref 3.87–5.11)
RDW: 13 % (ref 11.5–15.5)
WBC: 8 10*3/uL (ref 4.0–10.5)
nRBC: 0 % (ref 0.0–0.2)

## 2023-02-15 LAB — URINALYSIS, ROUTINE W REFLEX MICROSCOPIC
Bacteria, UA: NONE SEEN
Bilirubin Urine: NEGATIVE
Glucose, UA: >=500 mg/dL — AB
Hgb urine dipstick: NEGATIVE
Ketones, ur: NEGATIVE mg/dL
Leukocytes,Ua: NEGATIVE
Nitrite: NEGATIVE
Protein, ur: NEGATIVE mg/dL
Specific Gravity, Urine: 1.033 — ABNORMAL HIGH (ref 1.005–1.030)
pH: 5 (ref 5.0–8.0)

## 2023-02-15 LAB — ABO/RH: ABO/RH(D): B POS

## 2023-02-15 LAB — HCG, QUANTITATIVE, PREGNANCY: hCG, Beta Chain, Quant, S: 143932 m[IU]/mL — ABNORMAL HIGH (ref <5)

## 2023-02-15 NOTE — ED Triage Notes (Signed)
Pt in with co vaginal bleeding states is [redacted] weeks pregnant. Also co lower abd pain.

## 2023-02-15 NOTE — Discharge Instructions (Addendum)
 You had an ultrasound done that showed a fetus measuring gestational age [redacted] weeks 1 day.  Normal fetal heart rate.  Return to the emergency department if you have any worsening vaginal bleeding or abdominal pain.  Follow-up with your obstetrician.  Thank you for choosing us  for your health care, it was my pleasure to care for you today!  Clotilda Punter, MD

## 2023-02-15 NOTE — ED Provider Notes (Signed)
 [facility] Provider Note    Event Date/Time   First MD Initiated Contact with Patient 02/15/23 [phone]     (approximate)   History   Vaginal Bleeding   HPI  Meredith Rasmussen is a 37 y.o. female G5 P3 -1 twin pregnancy, 1 prior ectopic pregnancy, who presents to the emergency department with vaginal bleeding.  Endorses vaginal bleeding and lower abdominal pain that started earlier today.  States that since arriving to the emergency department her vaginal bleeding has decreased.  Less than a menstrual cycle.  Denies any dysuria, urinary urgency or frequency.  No significant abdominal pain at this time.  1 prior ectopic pregnancy.  States that she has a follow-up appointment with her obstetrician this next week.     Physical Exam   Triage Vital Signs: ED Triage Vitals  Encounter Vitals Group     BP 02/15/23 0143 113/64     Systolic BP Percentile --      Diastolic BP Percentile --      Pulse Rate 02/15/23 0143 86     Resp 02/15/23 0143 18     Temp 02/15/23 0143 98.6 F (37 C)     Temp Source 02/15/23 0143 Oral     SpO2 02/15/23 0143 97 %     Weight 02/15/23 0143 176 lb (79.8 kg)     Height 02/15/23 0143 4' 11 (1.499 m)     Head Circumference --      Peak Flow --      Pain Score 02/15/23 0148 4     Pain Loc --      Pain Education --      Exclude from Growth Chart --     Most recent vital signs: Vitals:   02/15/23 0143 02/15/23 0619  BP: 113/64 112/70  Pulse: 86 84  Resp: 18 18  Temp: 98.6 F (37 C) 98.3 F (36.8 C)  SpO2: 97% 98%    Physical Exam Constitutional:      Appearance: She is well-developed.  HENT:     Head: Atraumatic.  Eyes:     Conjunctiva/sclera: Conjunctivae normal.  Cardiovascular:     Rate and Rhythm: Regular rhythm.  Pulmonary:     Effort: No respiratory distress.  Abdominal:     General: There is no distension.     Tenderness: There is no abdominal tenderness.  Musculoskeletal:        General: Normal range of  motion.     Cervical back: Normal range of motion.  Skin:    General: Skin is warm.  Neurological:     Mental Status: She is alert. Mental status is at baseline.      IMPRESSION / MDM / ASSESSMENT AND PLAN / ED COURSE  I reviewed the triage vital signs and the nursing notes.  Differential diagnosis including miscarriage, threatened miscarriage, urinary tract infection, ectopic pregnancy   RADIOLOGY I independently reviewed imaging, my interpretation of imaging: Ultrasound with gestational sac, measuring at approximately [redacted] weeks gestational age with good fetal heart rate.   Labs (all labs ordered are listed, but only abnormal results are displayed) Labs interpreted as -    Labs Reviewed  CBC - Abnormal; Notable for the following components:      Result Value   MCHC 36.1 (*)    All other components within normal limits  COMPREHENSIVE METABOLIC PANEL - Abnormal; Notable for the following components:   Sodium 133 (*)    CO2 21 (*)  Glucose, Bld 116 (*)    AST 14 (*)    All other components within normal limits  HCG, QUANTITATIVE, PREGNANCY - Abnormal; Notable for the following components:   hCG, Beta Chain, Earleen RAMAN [phone] (*)    All other components within normal limits  URINALYSIS, ROUTINE W REFLEX MICROSCOPIC - Abnormal; Notable for the following components:   Color, Urine YELLOW (*)    APPearance CLEAR (*)    Specific Gravity, Urine 1.033 (*)    Glucose, UA >=500 (*)    All other components within normal limits  ABO/RH    Blood type is Rh+.  No signs of urinary tract infection.  Hemoglobin is stable.  No significant electrolyte abnormality.  Ultrasound with intrauterine pregnancy measuring [redacted] weeks gestational age.  No signs of a heterotopic pregnancy.  Clinical picture consistent with threatened miscarriage.  Given return precautions for vaginal bleeding or worsening pain.  Discussed follow-up with her obstetrician.     PROCEDURES:  Critical Care performed:  No  Procedures  Patient's presentation is most consistent with acute presentation with potential threat to life or bodily function.   MEDICATIONS ORDERED IN ED: Medications - No data to display  FINAL CLINICAL IMPRESSION(S) / ED DIAGNOSES   Final diagnoses:  Vaginal bleeding  Threatened miscarriage     Rx / DC Orders   ED Discharge Orders     None        Note:  This document was prepared using Dragon voice recognition software and may include unintentional dictation errors.   Suzanne Kirsch, MD 02/15/23 1010

## 2023-03-03 LAB — PANORAMA PRENATAL TEST FULL PANEL:PANORAMA TEST PLUS 5 ADDITIONAL MICRODELETIONS: FETAL FRACTION: 4.6

## 2023-07-15 ENCOUNTER — Other Ambulatory Visit: Payer: Self-pay

## 2023-07-15 ENCOUNTER — Observation Stay
Admission: EM | Admit: 2023-07-15 | Discharge: 2023-07-15 | Disposition: A | Discharge status: Home / Self Care | Attending: Licensed Practical Nurse | Admitting: Licensed Practical Nurse

## 2023-07-15 ENCOUNTER — Encounter: Payer: Self-pay | Admitting: Obstetrics and Gynecology

## 2023-07-15 DIAGNOSIS — O26893 Other specified pregnancy related conditions, third trimester: Secondary | ICD-10-CM | POA: Diagnosis present

## 2023-07-15 DIAGNOSIS — R109 Unspecified abdominal pain: Secondary | ICD-10-CM | POA: Diagnosis not present

## 2023-07-15 DIAGNOSIS — Z79899 Other long term (current) drug therapy: Secondary | ICD-10-CM | POA: Diagnosis not present

## 2023-07-15 DIAGNOSIS — Z7984 Long term (current) use of oral hypoglycemic drugs: Secondary | ICD-10-CM | POA: Diagnosis not present

## 2023-07-15 DIAGNOSIS — O24113 Pre-existing diabetes mellitus, type 2, in pregnancy, third trimester: Secondary | ICD-10-CM | POA: Diagnosis not present

## 2023-07-15 DIAGNOSIS — O99891 Other specified diseases and conditions complicating pregnancy: Secondary | ICD-10-CM

## 2023-07-15 DIAGNOSIS — E119 Type 2 diabetes mellitus without complications: Secondary | ICD-10-CM

## 2023-07-15 DIAGNOSIS — Z3A30 30 weeks gestation of pregnancy: Secondary | ICD-10-CM | POA: Diagnosis not present

## 2023-07-15 DIAGNOSIS — O24013 Pre-existing diabetes mellitus, type 1, in pregnancy, third trimester: Secondary | ICD-10-CM | POA: Diagnosis not present

## 2023-07-15 DIAGNOSIS — O358XX0 Maternal care for other (suspected) fetal abnormality and damage, not applicable or unspecified: Secondary | ICD-10-CM

## 2023-07-15 DIAGNOSIS — O09523 Supervision of elderly multigravida, third trimester: Secondary | ICD-10-CM

## 2023-07-15 DIAGNOSIS — Z794 Long term (current) use of insulin: Secondary | ICD-10-CM

## 2023-07-15 LAB — CBC WITH DIFFERENTIAL/PLATELET
Abs Immature Granulocytes: 0.02 10*3/uL (ref 0.00–0.07)
Basophils Absolute: 0.1 10*3/uL (ref 0.0–0.1)
Basophils Relative: 1 %
Eosinophils Absolute: 0.1 10*3/uL (ref 0.0–0.5)
Eosinophils Relative: 2 %
HCT: 31.7 % — ABNORMAL LOW (ref 36.0–46.0)
Hemoglobin: 11.4 g/dL — ABNORMAL LOW (ref 12.0–15.0)
Immature Granulocytes: 0 %
Lymphocytes Relative: 28 %
Lymphs Abs: 2.4 10*3/uL (ref 0.7–4.0)
MCH: 30.2 pg (ref 26.0–34.0)
MCHC: 36 g/dL (ref 30.0–36.0)
MCV: 83.9 fL (ref 80.0–100.0)
Monocytes Absolute: 0.6 10*3/uL (ref 0.1–1.0)
Monocytes Relative: 7 %
Neutro Abs: 5.4 10*3/uL (ref 1.7–7.7)
Neutrophils Relative %: 62 %
Platelets: 216 10*3/uL (ref 150–400)
RBC: 3.78 MIL/uL — ABNORMAL LOW (ref 3.87–5.11)
RDW: 12 % (ref 11.5–15.5)
WBC: 8.6 10*3/uL (ref 4.0–10.5)
nRBC: 0 % (ref 0.0–0.2)

## 2023-07-15 LAB — COMPREHENSIVE METABOLIC PANEL WITH GFR
ALT: 16 U/L (ref 0–44)
AST: 16 U/L (ref 15–41)
Albumin: 3.1 g/dL — ABNORMAL LOW (ref 3.5–5.0)
Alkaline Phosphatase: 72 U/L (ref 38–126)
Anion gap: 7 (ref 5–15)
BUN: 9 mg/dL (ref 6–20)
CO2: 24 mmol/L (ref 22–32)
Calcium: 9.4 mg/dL (ref 8.9–10.3)
Chloride: 103 mmol/L (ref 98–111)
Creatinine, Ser: 0.59 mg/dL (ref 0.44–1.00)
GFR, Estimated: >60 mL/min (ref >60)
Glucose, Bld: 70 mg/dL (ref 70–99)
Potassium: 3.4 mmol/L — ABNORMAL LOW (ref 3.5–5.1)
Sodium: 134 mmol/L — ABNORMAL LOW (ref 135–145)
Total Bilirubin: 0.4 mg/dL (ref 0.0–1.2)
Total Protein: 6.6 g/dL (ref 6.5–8.1)

## 2023-07-15 LAB — URINALYSIS, COMPLETE (UACMP) WITH MICROSCOPIC
Bacteria, UA: NONE SEEN
Bilirubin Urine: NEGATIVE
Glucose, UA: NEGATIVE mg/dL
Hgb urine dipstick: NEGATIVE
Ketones, ur: NEGATIVE mg/dL
Leukocytes,Ua: NEGATIVE
Nitrite: NEGATIVE
Protein, ur: NEGATIVE mg/dL
RBC / HPF: 0 RBC/hpf (ref 0–5)
Specific Gravity, Urine: 1.014 (ref 1.005–1.030)
pH: 6 (ref 5.0–8.0)

## 2023-07-15 MED ORDER — ACETAMINOPHEN 500 MG PO TABS
ORAL_TABLET | ORAL | Status: AC
  Filled 2023-07-15: qty 2

## 2023-07-15 MED ORDER — SIMETHICONE 80 MG PO CHEW
80.0000 mg | CHEWABLE_TABLET | Freq: Four times a day (QID) | ORAL | Status: DC | PRN
  Administered 2023-07-15: 80 mg via ORAL
  Filled 2023-07-15: qty 1

## 2023-07-15 MED ORDER — ACETAMINOPHEN 500 MG PO TABS
1000.0000 mg | ORAL_TABLET | Freq: Four times a day (QID) | ORAL | Status: DC | PRN
  Administered 2023-07-15: 1000 mg via ORAL

## 2023-07-15 MED ORDER — SIMETHICONE 80 MG PO CHEW: 80.0000 mg | CHEWABLE_TABLET | Freq: Four times a day (QID) | ORAL | Status: AC | PRN

## 2023-07-15 MED ORDER — ACETAMINOPHEN 500 MG PO TABS: 1000.0000 mg | ORAL_TABLET | Freq: Four times a day (QID) | ORAL | Status: AC | PRN

## 2023-07-15 MED ORDER — INSULIN NPH (HUMAN) (ISOPHANE) 100 UNIT/ML ~~LOC~~ SUSP
16.0000 [IU] | Freq: Every day | SUBCUTANEOUS | Status: DC
  Administered 2023-07-15: 16 [IU] via SUBCUTANEOUS
  Filled 2023-07-15: qty 10

## 2023-07-15 NOTE — OB Triage Provider Note (Addendum)
 Obstetric H&P   Chief Complaint: abdominal  pain   Prenatal Care Provider: [facility]   History of Present Illness: 38 y.o. [id number] [redacted]w[redacted]d by 09/21/2023, Date entered prior to episode creation presenting to L&D for evaluation for abdominal pain. The pain started around 7pm, it started across the top of her abdomen, felt like cramping,  and was a "10/10". The pain is now on her left side and some on her right side, coming and going and a "5/10". She has not tried anything for the pain. She ate shrimp with alfredo around 5pm. She has had nausea on and off since Monday. She does struggle with heartburn, she did take TUMS earlier today-denies heartburn currently.  Denies vomiting or fevers. She has been dealing with constipation, she did have  BM today. She endorses +FM, denies contractions, LOF/VB or changes to her discharge. Surgical hx includes ectopic pregnancy.   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   Pregravid weight Pregravid weight not on file Total Weight Gain Not found.  pregnancy Problems (from 07/15/23 to present)     No problems associated with this episode.        Review of Systems: 10 point review of systems negative unless otherwise noted in HPI  Past Medical History: Patient Active Problem List   Diagnosis Date Noted Date Diagnosed   Irregular contractions 01/17/2015    Labor and delivery, indication for care 12/21/2014    Labor and delivery indication for care or intervention 12/01/2014    Abdominal pain 10/16/2014     Past Surgical History: Past Surgical History:  Procedure Laterality Date   ECTOPIC PREGNANCY SURGERY      Past Obstetric History: # 1 - Date: 02/14/06, Sex: Female, Weight: None, GA: None, Type: None, Apgar1: None, Apgar5: None, Living: Living, Birth Comments: None  # 2 - Date: 12/21/11, Sex:  None, Weight: None, GA: None, Type: None, Apgar1: None, Apgar5: None, Living: None, Birth Comments: None  # 3 - Date: 2014, Sex: None, Weight: None, GA: None, Type: None, Apgar1: None, Apgar5: None, Living: None, Birth Comments: None  # 4 - Date: 02/03/15, Sex: Female, Weight: None, GA: [redacted]w[redacted]d, Type: None, Apgar1: None, Apgar5: None, Living: Living, Birth Comments: None  # 5 - Date: 02/03/15, Sex: None, Weight: None, GA: None, Type: None, Apgar1: None, Apgar5: None, Living: None, Birth Comments: None  # 6 - Date: None, Sex: None, Weight: None, GA: None, Type: None, Apgar1: None, Apgar5: None, Living: None, Birth Comments: None   Past Gynecologic History:  Family History: History reviewed. No pertinent family history.  Social History: Social History   Socioeconomic History   Marital status: Single    Spouse name: Not on file   Number of children: Not on file   Years of education: Not on file   Highest education level: Not on file  Occupational History   Not on file  Tobacco Use   Smoking status: Never   Smokeless tobacco: Never  Vaping Use   Vaping status: Never Used  Substance and Sexual Activity   Alcohol use: Not Currently   Drug use: No   Sexual activity: Yes    Birth control/protection: Pill  Other Topics Concern   Not on file  Social History Narrative   Not on file   Social Drivers of Health   Financial Resource Strain: Not on  file  Food Insecurity: No Food Insecurity (06/21/2023)   Received from [facility]   Hunger Vital Sign    Worried About Running Out of Food in the Last Year: Never true    Ran Out of Food in the Last Year: Never true  Transportation Needs: No Transportation Needs (06/21/2023)   Received from [facility] - Transportation    Lack of Transportation (Medical): No    Lack of Transportation (Non-Medical): No  Physical Activity: Not on file  Stress: Not on file  Social Connections: Not on file  Intimate Partner Violence: Not  At Risk (01/24/2023)   Humiliation, Afraid, Rape, and Kick questionnaire    Fear of Current or Ex-Partner: No    Emotionally Abused: No    Physically Abused: No    Sexually Abused: No    Medications: Prior to Admission medications   Medication Sig Start Date End Date Taking? Authorizing Provider  calcium carbonate (TUMS - DOSED IN MG ELEMENTAL CALCIUM) 500 MG chewable tablet Chew 1 tablet by mouth daily.   Yes [provider]  docusate sodium (COLACE) 100 MG capsule Take 100 mg by mouth 2 (two) times daily. Patient not taking: Reported on 11/15/2022    [provider]  famotidine  (PEPCID ) 40 MG tablet Take 1 tablet (40 mg total) by mouth every evening. 05/16/17 05/16/18  Sharan Dash, MD  ferrous fumarate (HEMOCYTE - 106 MG FE) 325 (106 FE) MG TABS tablet Take 1 tablet by mouth. Patient not taking: Reported on 01/24/2023    [provider]  glipiZIDE  (GLUCOTROL ) 5 MG tablet Take 2 tablets (10 mg total) by mouth daily before breakfast. Patient not taking: Reported on 01/24/2023 03/30/18   Isa Manuel, MD  ibuprofen  (ADVIL ,MOTRIN ) 600 MG tablet Take 1 tablet (600 mg total) by mouth every 6 (six) hours as needed for moderate pain. Patient not taking: Reported on 01/24/2023 04/09/18   Coralyn Derry, PA-C  metFORMIN (GLUCOPHAGE) 1000 MG tablet Take 1,000 mg by mouth 2 (two) times daily with a meal. Patient not taking: Reported on 07/15/2023    [provider]  sucralfate  (CARAFATE ) 1 g tablet Take 1 tablet (1 g total) by mouth 2 (two) times daily. Patient not taking: Reported on 11/15/2022 05/16/17   Sharan Dash, MD    Allergies: Allergies  Allergen Reactions   Januvia [Sitagliptin] Anaphylaxis   Other Hives and Rash    "APRICOT"    Physical Exam: Vitals: Blood pressure (!) 93/54, pulse 90, temperature 98.3 F (36.8 C), temperature source Oral, resp. rate 15, height 4\' 11"  (1.499 m), weight 85.7 kg, last menstrual period  12/15/2022.  Urine Dip Protein: negative   FHT: baseline 125, moderate variability, pos accel, neg decel  Toco: rare contraction   General: NAD HEENT: normocephalic, anicteric Pulmonary: No increased work of breathing Cardiovascular: RRR, distal pulses 2+ Abdomen: Gravid, some tenderness with palpation midline and in RLQ  Genitourinary: deferred  Extremities: no edema, erythema, or tenderness Neurologic: Grossly intact Psychiatric: mood appropriate, affect full  Labs: No results found for this or any previous visit (from the past 24 hours).  Assessment: 38 y.o. [id number] [redacted]w[redacted]d with abdominal pain    Plan: 1) CBC, CMP, UA ordered  2) Fetus -category I tracing   3) Simethicone  and Tylenol  given, pt to take own Novolin 16 units [place]   4) Immunization History -  Immunization History  Administered Date(s) Administered   PPD Test 11/15/2022    5) Disposition -  Anice Kerbs CNM   [facility] 07/15/2023, 10:11 PM

## 2023-07-15 NOTE — OB Triage Note (Signed)
 Meredith Rasmussen 38 y.o. @[redacted]w[redacted]d  [id number]  presents to Labor & Delivery triage via wheelchair steered by ED staff reporting abdominal pain starting today around 1900. Pt gets prenatal care at [facility] M[facility]. She reports type 2 DM that is insulin controlled and a fetal cardiac anamoly. Pt started her pain was a 10/10 constant cramping pain but now rates it a 5/10 that comes and goes. Pt denies any recent falls or abdominal trauma. She denies signs and symptoms consistent with rupture of membranes or active vaginal bleeding. She denies contractions and states positive fetal movement. External FM and TOCO applied to non-tender abdomen. Initial FHR 145. Vital signs obtained and within normal limits. Patient oriented to care environment including call bell and bed control use. Dominic, CNM notified of patient's arrival. Plan to observe. Jordon Kristiansen L. Yelitza Reach, RN BSN 07/15/2023 9:48 PM

## 2023-07-15 NOTE — Discharge Summary (Signed)
 Physician Final Progress Note  Patient ID: Meredith Rasmussen MRN: [id number] DOB/AGE: 11-02-1985 38 y.o.  Admit date: 07/15/2023 Admitting provider: Carolynn Citrin, MD Discharge date: 07/15/2023   Admission Diagnoses:  1) intrauterine pregnancy at [redacted]w[redacted]d  2) abdominal pain   Discharge Diagnoses:  Active Problems:   * No active hospital problems. *  Gas pain   History of Present Illness: 38 y.o. [id number] [redacted]w[redacted]d by 09/21/2023, Date entered prior to episode creation presenting to L&D for evaluation for abdominal pain. The pain started around 7pm, it started across the top of her abdomen, felt like cramping,  and was a "10/10". The pain is now on her left side and some on her right side, coming and going and a "5/10". She has not tried anything for the pain. She ate shrimp with alfredo around 5pm. She has had nausea on and off since Monday. She does struggle with heartburn, she did take TUMS earlier today-denies heartburn currently.  Denies vomiting or fevers. She has been dealing with constipation, she did have  BM today. She endorses +FM, denies contractions, LOF/VB or changes to her discharge. Surgical hx includes ectopic pregnancy.    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     Past Medical History:  Diagnosis Date   Diabetes mellitus without complication (HCC)    Ectopic pregnancy     Past Surgical History:  Procedure Laterality Date   ECTOPIC PREGNANCY SURGERY      No current facility-administered medications on file prior to encounter.   Current Outpatient Medications on File Prior to Encounter  Medication Sig Dispense Refill   calcium carbonate (TUMS - DOSED IN MG ELEMENTAL CALCIUM) 500 MG chewable tablet Chew 1 tablet by mouth daily.     docusate sodium (COLACE) 100 MG capsule Take 100 mg by mouth 2 (two) times daily.  (Patient not taking: Reported on 11/15/2022)     famotidine  (PEPCID ) 40 MG tablet Take 1 tablet (40 mg total) by mouth every evening. 14 tablet 0   ferrous fumarate (HEMOCYTE - 106 MG FE) 325 (106 FE) MG TABS tablet Take 1 tablet by mouth. (Patient not taking: Reported on 01/24/2023)     glipiZIDE  (GLUCOTROL ) 5 MG tablet Take 2 tablets (10 mg total) by mouth daily before breakfast. (Patient not taking: Reported on 01/24/2023) 30 tablet 0   ibuprofen  (ADVIL ,MOTRIN ) 600 MG tablet Take 1 tablet (600 mg total) by mouth every 6 (six) hours as needed for moderate pain. (Patient not taking: Reported on 01/24/2023) 30 tablet 0   [START ON 07/16/2023] insulin  NPH Human (NOVOLIN N) 100 UNIT/ML injection Inject 0.16 mLs (16 Units total) into the skin at bedtime.     metFORMIN (GLUCOPHAGE) 1000 MG tablet Take 1,000 mg by mouth 2 (two) times daily with a meal. (Patient not taking: Reported on 07/15/2023)     sucralfate  (CARAFATE ) 1 g tablet Take 1 tablet (1 g total) by mouth 2 (two) times daily. (Patient not taking: Reported on 11/15/2022) 20 tablet 0    Allergies  Allergen Reactions   Januvia [Sitagliptin] Anaphylaxis   Other Hives and Rash    "APRICOT"    Social History   Socioeconomic History   Marital status: Single    Spouse name: Not on file   Number of children: Not on file   Years of education: Not on file   Highest  education level: Not on file  Occupational History   Not on file  Tobacco Use   Smoking status: Never   Smokeless tobacco: Never  Vaping Use   Vaping status: Never Used  Substance and Sexual Activity   Alcohol use: Not Currently   Drug use: No   Sexual activity: Yes    Birth control/protection: Pill  Other Topics Concern   Not on file  Social History Narrative   Not on file   Social Drivers of Health   Financial Resource Strain: Not on file  Food Insecurity: No Food Insecurity (06/21/2023)   Received from [facility]   Hunger Vital Sign    Worried About Running Out  of Food in the Last Year: Never true    Ran Out of Food in the Last Year: Never true  Transportation Needs: No Transportation Needs (06/21/2023)   Received from [facility] - Transportation    Lack of Transportation (Medical): No    Lack of Transportation (Non-Medical): No  Physical Activity: Not on file  Stress: Not on file  Social Connections: Not on file  Intimate Partner Violence: Not At Risk (01/24/2023)   Humiliation, Afraid, Rape, and Kick questionnaire    Fear of Current or Ex-Partner: No    Emotionally Abused: No    Physically Abused: No    Sexually Abused: No    History reviewed. No pertinent family history.   ROS see HPI   Physical Exam: BP 100/61   Pulse 90   Temp 98.3 F (36.8 C) (Oral)   Resp 15   Ht 4\' 11"  (1.499 m)   Wt 85.7 kg   LMP 12/15/2022 (Approximate)   BMI 38.17 kg/m   Physical Exam Constitutional:      Appearance: Normal appearance.  Cardiovascular:     Rate and Rhythm: Normal rate.     Pulses: Normal pulses.  Pulmonary:     Effort: Pulmonary effort is normal.  Abdominal:     General: There is no distension.     Palpations: There is no mass.     Tenderness: There is no guarding or rebound.     Comments: Gravid, some tenderness with palpation over midline and RLQ   Musculoskeletal:     Cervical back: Normal range of motion.  Neurological:     Mental Status: She is alert.  Psychiatric:        Mood and Affect: Mood normal.        Thought Content: Thought content normal.    MARIGRACE Rasmussen 02/14/1986 [redacted]w[redacted]d  Fetus A Non-Stress Test Interpretation for 07/15/23  Indication: triage visit   Fetal Heart Rate A Mode: External Baseline Rate (A): 135 bpm (fht) Variability: Moderate Accelerations: 15 x 15 Decelerations: None  Uterine Activity Mode: Toco Contraction Frequency (min): none noted (pt movement on toco)      Consults: None  Significant Findings/ Diagnostic Studies: UA negative, CBC and CMP WNL except sodium  and potassium just below normal.   Procedures: RNST   Hospital Course: The patient was admitted to Labor and Delivery Triage for observation. She had a RNST. She was given Tylenol  and Simethicone. She took her night time Novolin 16 units [place] and had a snack of applesauce and crackers. An hour later she reported she no longer had pain and desired to go home. Reviewed symptoms most likely related to digestion. Comfort measures reviewed.   Discharge Condition: stable  Disposition: Discharge disposition: 01-Home or Self Care  Diet: Diabetic diet  Discharge Activity: Activity as tolerated   Allergies as of 07/15/2023       Reactions   Januvia [sitagliptin] Anaphylaxis   Other Hives, Rash   "APRICOT"        Medication List     STOP taking these medications    glipiZIDE  5 MG tablet Commonly known as: GLUCOTROL    ibuprofen  600 MG tablet Commonly known as: ADVIL    metFORMIN 1000 MG tablet Commonly known as: GLUCOPHAGE   sucralfate  1 g tablet Commonly known as: Carafate        TAKE these medications    acetaminophen  500 MG tablet Commonly known as: TYLENOL  Take 2 tablets (1,000 mg total) by mouth every 6 (six) hours as needed for moderate pain (pain score 4-6).   calcium carbonate 500 MG chewable tablet Commonly known as: TUMS - dosed in mg elemental calcium Chew 1 tablet by mouth daily.   docusate sodium 100 MG capsule Commonly known as: COLACE Take 100 mg by mouth 2 (two) times daily.   famotidine  40 MG tablet Commonly known as: PEPCID  Take 1 tablet (40 mg total) by mouth every evening.   ferrous fumarate 325 (106 Fe) MG Tabs tablet Commonly known as: HEMOCYTE - 106 mg FE Take 1 tablet by mouth.   insulin NPH Human 100 UNIT/ML injection Commonly known as: NOVOLIN N Inject 0.16 mLs (16 Units total) into the skin at bedtime. Start taking on: Jul 16, 2023   simethicone 80 MG chewable tablet Commonly known as: MYLICON Chew 1 tablet (80 mg total) by  mouth 4 (four) times daily as needed for flatulence.         Total time spent taking care of this patient: 20 minutes  Signed: Berkley Breech [facility], CNM  07/15/2023, 11:17 PM

## 2023-07-29 MED FILL — Insulin NPH (Human) (Isophane) Inj 100 Unit/ML: SUBCUTANEOUS | Qty: 10 | Status: AC

## 2023-08-12 ENCOUNTER — Encounter: Payer: Self-pay | Admitting: Obstetrics

## 2023-08-12 ENCOUNTER — Other Ambulatory Visit: Payer: Self-pay

## 2023-08-12 ENCOUNTER — Observation Stay
Admission: EM | Admit: 2023-08-12 | Discharge: 2023-08-13 | Disposition: A | Discharge status: Home / Self Care | Admitting: Obstetrics

## 2023-08-12 DIAGNOSIS — R109 Unspecified abdominal pain: Secondary | ICD-10-CM

## 2023-08-12 DIAGNOSIS — O26893 Other specified pregnancy related conditions, third trimester: Secondary | ICD-10-CM | POA: Diagnosis not present

## 2023-08-12 DIAGNOSIS — Z3A34 34 weeks gestation of pregnancy: Secondary | ICD-10-CM | POA: Insufficient documentation

## 2023-08-12 DIAGNOSIS — O4703 False labor before 37 completed weeks of gestation, third trimester: Secondary | ICD-10-CM | POA: Diagnosis present

## 2023-08-12 NOTE — OB Triage Provider Note (Signed)
 LABOR & DELIVERY OB TRIAGE NOTE  SUBJECTIVE  HPI Meredith Rasmussen is a 38 y.o. [id number] at [redacted]w[redacted]d who presents to Labor & Delivery for evaluation of contractions. She receives regular prenatal care with [facility] MFM and was seen earlier today in their office where she had a reactive NST. 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   She reports having on and off contractions over the past week. She was admitted to [facility] L&D on 6/24 where a complete preterm labor evaluation was completed. During the course of her stay, she continued to contract but made no cervical change. Her exam at that time was 1/75/-3. She was found to have BV and was given a prescription for treatment. She was discharged in stable condition with preterm labor precautions. She reports that she just picked up the prescription for metronidazole today and has only taken the first dose.   This evening, she reports her contraction pain at an 8/10 occasionally with some being less painful. She endorses normal fetal movement. She denies LOF and vaginal bleeding.  OB History     Gravida  6   Para  3   Term  3   Preterm  0   AB  2   Living  2      SAB  0   IAB  0   Ectopic  1   Multiple  1   Live Births  2           Scheduled Meds: none Continuous Infusions: none PRN Meds:.none  OBJECTIVE  BP (!) 111/56   Pulse 71   Temp 98.3 F (36.8 C) (Oral)   Resp 16   LMP 12/15/2022 (Approximate)   General: AOx4 Heart: regular heart rate Lungs: normal work of breathing Abdomen: gravid, soft, non-tender, contractions palpate soft Cervical exam: Dilation: 1 Effacement (%): 50 Station: -3 Exam by:: Lathaniel Legate CNM   NST I reviewed the NST and it was reactive.  Baseline: 125 bpm Variability: moderate Accelerations: present (greater than two 15x15 bpm) Decelerations:none Toco: contractions q 1-5 min Category I  ASSESSMENT Impression  1) Pregnancy at [id number], [redacted]w[redacted]d, Estimated Date of  Delivery: 09/21/23 2) Reassuring maternal/fetal status 3) Despite contractions noted on monitor, no cervical change over one hour. Also SVE is unchanged from exam performed at [facility] on 6/24.   PLAN 1) Reviewed reassuring FHR tracing and lack of cervical change with patient. Reviewed comfort measures for abdominal pain and encouraged hydration.  2) Patient discharged in stable condition with preterm labor precautions. Continue with routine prenatal care at [facility] MFM. '  Plan of care reviewed with Dr. Leigh who was in agreement and immediately available for the care of this patient.   Lauraine PARAS Andru Genter, CNM  08/12/23  11:54 PM

## 2023-08-12 NOTE — OB Triage Provider Note (Incomplete)
 LABOR & DELIVERY OB TRIAGE NOTE  SUBJECTIVE  HPI Meredith Rasmussen is a 38 y.o. [id number] at [redacted]w[redacted]d who presents to Labor & Delivery for evaluation of contractions. She receives regular prenatal care with [facility] MFM and was seen earlier today in their office where she had a reactive NST. 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   She reports having on and off contractions over the past week. She was admitted to [facility] L&D on 6/24 and had a complete preterm labor evaluation. During the course of her stay, she continued to contract but made no cervical change. Her exam at that time was 1/75/-3. She was found to have BV and was given a prescription for treatment. She was discharged in stable condition with preterm labor precautions.   This evening, she reports her contraction pain at an 8/10 occasionally with some being less painful. She endorses normal fetal movement. She denies LOF and vaginal bleeding.  OB History     Gravida  6   Para  3   Term  3   Preterm  0   AB  2   Living  2      SAB  0   IAB  0   Ectopic  1   Multiple  1   Live Births  2           Scheduled Meds: none Continuous Infusions: none PRN Meds:.none  OBJECTIVE  BP (!) 111/56   Pulse 71   Temp 98.3 F (36.8 C) (Oral)   Resp 16   LMP 12/15/2022 (Approximate)   General: AOx4 Heart: regular heart rate Lungs: normal work of breathing Abdomen: gravid, soft, non-tender Cervical exam: Dilation: 1 Effacement (%): 50 Station: -3 Exam by:: Willam Munford CNM   NST I reviewed the NST and it was reactive.  Baseline: 125 bpm Variability: moderate Accelerations: present (greater than two 15x15 bpm) Decelerations:none Toco: contractions q  Category  ASSESSMENT Impression  1) Pregnancy at [id number], [redacted]w[redacted]d, Estimated Date of Delivery: 09/21/23 2) Reassuring maternal/fetal status  PLAN   Lauraine PARAS Krystle Oberman, CNM  08/12/23  11:54 PM

## 2023-08-13 ENCOUNTER — Encounter: Payer: Self-pay | Admitting: Obstetrics

## 2023-08-13 DIAGNOSIS — O4703 False labor before 37 completed weeks of gestation, third trimester: Secondary | ICD-10-CM | POA: Diagnosis not present

## 2023-08-13 NOTE — Progress Notes (Signed)
 Discharge instructions provided to patient. Patient verbalized understanding. Pt educated on signs and symptoms of labor, vaginal bleeding, LOF, and fetal movement. Red flag signs reviewed by RN. Patient discharged home with significant other in stable condition.

## 2023-08-13 NOTE — OB Triage Note (Signed)
 Patient is a 38 yo, G6P3, at 34 weeks and 3 days. Patient presents with complaints of contractions. Pt reports feeling her contractions on and off since last Monday. Reported being seen and admitted by [facility] for contractions last Tuesday and was d/c due to no cervical change. Patient denies any vaginal bleeding or LOF. Patient reports +FM. Monitors applied and assessing. Initial fetal heart tone is 135. S Free CNM notified of patients arrival to unit. Plan to do labor eval

## 2024-01-03 ENCOUNTER — Ambulatory Visit
